# Patient Record
Sex: Female | Born: 1935 | Race: White | Hispanic: No | Marital: Married | Smoking: Never smoker
Health system: Southern US, Community
[De-identification: ages and names within clinical notes are randomized; demographics above are authoritative.]

## PROBLEM LIST (undated history)

## (undated) DIAGNOSIS — K439 Ventral hernia without obstruction or gangrene: Secondary | ICD-10-CM

## (undated) DIAGNOSIS — F0390 Unspecified dementia without behavioral disturbance: Secondary | ICD-10-CM

## (undated) DIAGNOSIS — M1990 Unspecified osteoarthritis, unspecified site: Secondary | ICD-10-CM

## (undated) DIAGNOSIS — N390 Urinary tract infection, site not specified: Secondary | ICD-10-CM

## (undated) DIAGNOSIS — D649 Anemia, unspecified: Secondary | ICD-10-CM

## (undated) DIAGNOSIS — K449 Diaphragmatic hernia without obstruction or gangrene: Secondary | ICD-10-CM

## (undated) DIAGNOSIS — N3281 Overactive bladder: Secondary | ICD-10-CM

## (undated) DIAGNOSIS — K219 Gastro-esophageal reflux disease without esophagitis: Secondary | ICD-10-CM

## (undated) DIAGNOSIS — I517 Cardiomegaly: Secondary | ICD-10-CM

## (undated) DIAGNOSIS — E039 Hypothyroidism, unspecified: Secondary | ICD-10-CM

## (undated) DIAGNOSIS — F329 Major depressive disorder, single episode, unspecified: Secondary | ICD-10-CM

## (undated) DIAGNOSIS — E539 Vitamin B deficiency, unspecified: Secondary | ICD-10-CM

## (undated) DIAGNOSIS — I10 Essential (primary) hypertension: Secondary | ICD-10-CM

## (undated) DIAGNOSIS — I2510 Atherosclerotic heart disease of native coronary artery without angina pectoris: Secondary | ICD-10-CM

## (undated) HISTORY — PX: TOTAL VAGINAL HYSTERECTOMY: SHX2548

## (undated) HISTORY — PX: COLONOSCOPY: SHX174

---

## 2015-06-13 ENCOUNTER — Encounter
Admission: EM | Disposition: A | Discharge status: Other Acute Inpatient Hospital | Payer: Self-pay | Source: Home / Self Care | Attending: Internal Medicine

## 2015-06-13 ENCOUNTER — Inpatient Hospital Stay
Admission: EM | Admit: 2015-06-13 | Discharge: 2015-06-17 | DRG: 369 | Payer: Medicare (Managed Care) | Attending: Internal Medicine | Admitting: Internal Medicine

## 2015-06-13 ENCOUNTER — Inpatient Hospital Stay: Payer: Medicare (Managed Care) | Admitting: Anesthesiology

## 2015-06-13 ENCOUNTER — Encounter: Payer: Self-pay | Admitting: Emergency Medicine

## 2015-06-13 ENCOUNTER — Emergency Department: Payer: Medicare (Managed Care)

## 2015-06-13 DIAGNOSIS — Z66 Do not resuscitate: Secondary | ICD-10-CM | POA: Diagnosis present

## 2015-06-13 DIAGNOSIS — R140 Abdominal distension (gaseous): Secondary | ICD-10-CM | POA: Diagnosis present

## 2015-06-13 DIAGNOSIS — K449 Diaphragmatic hernia without obstruction or gangrene: Secondary | ICD-10-CM | POA: Diagnosis present

## 2015-06-13 DIAGNOSIS — K922 Gastrointestinal hemorrhage, unspecified: Secondary | ICD-10-CM

## 2015-06-13 DIAGNOSIS — E539 Vitamin B deficiency, unspecified: Secondary | ICD-10-CM | POA: Diagnosis present

## 2015-06-13 DIAGNOSIS — R112 Nausea with vomiting, unspecified: Secondary | ICD-10-CM

## 2015-06-13 DIAGNOSIS — Z886 Allergy status to analgesic agent status: Secondary | ICD-10-CM

## 2015-06-13 DIAGNOSIS — K226 Gastro-esophageal laceration-hemorrhage syndrome: Secondary | ICD-10-CM | POA: Diagnosis present

## 2015-06-13 DIAGNOSIS — Z7982 Long term (current) use of aspirin: Secondary | ICD-10-CM | POA: Diagnosis not present

## 2015-06-13 DIAGNOSIS — Z87440 Personal history of urinary (tract) infections: Secondary | ICD-10-CM | POA: Diagnosis not present

## 2015-06-13 DIAGNOSIS — K2971 Gastritis, unspecified, with bleeding: Secondary | ICD-10-CM | POA: Diagnosis present

## 2015-06-13 DIAGNOSIS — F329 Major depressive disorder, single episode, unspecified: Secondary | ICD-10-CM | POA: Diagnosis present

## 2015-06-13 DIAGNOSIS — E039 Hypothyroidism, unspecified: Secondary | ICD-10-CM | POA: Diagnosis present

## 2015-06-13 DIAGNOSIS — I10 Essential (primary) hypertension: Secondary | ICD-10-CM | POA: Diagnosis present

## 2015-06-13 DIAGNOSIS — R0902 Hypoxemia: Secondary | ICD-10-CM | POA: Diagnosis present

## 2015-06-13 DIAGNOSIS — K311 Adult hypertrophic pyloric stenosis: Secondary | ICD-10-CM | POA: Diagnosis present

## 2015-06-13 DIAGNOSIS — R1110 Vomiting, unspecified: Secondary | ICD-10-CM | POA: Insufficient documentation

## 2015-06-13 DIAGNOSIS — K259 Gastric ulcer, unspecified as acute or chronic, without hemorrhage or perforation: Secondary | ICD-10-CM | POA: Diagnosis present

## 2015-06-13 DIAGNOSIS — R109 Unspecified abdominal pain: Secondary | ICD-10-CM

## 2015-06-13 DIAGNOSIS — M1990 Unspecified osteoarthritis, unspecified site: Secondary | ICD-10-CM | POA: Diagnosis present

## 2015-06-13 DIAGNOSIS — Z931 Gastrostomy status: Secondary | ICD-10-CM

## 2015-06-13 DIAGNOSIS — I2510 Atherosclerotic heart disease of native coronary artery without angina pectoris: Secondary | ICD-10-CM | POA: Diagnosis present

## 2015-06-13 DIAGNOSIS — Z90710 Acquired absence of both cervix and uterus: Secondary | ICD-10-CM

## 2015-06-13 DIAGNOSIS — K219 Gastro-esophageal reflux disease without esophagitis: Secondary | ICD-10-CM | POA: Diagnosis present

## 2015-06-13 DIAGNOSIS — Z9981 Dependence on supplemental oxygen: Secondary | ICD-10-CM

## 2015-06-13 DIAGNOSIS — I480 Paroxysmal atrial fibrillation: Secondary | ICD-10-CM | POA: Diagnosis present

## 2015-06-13 DIAGNOSIS — F0390 Unspecified dementia without behavioral disturbance: Secondary | ICD-10-CM | POA: Diagnosis present

## 2015-06-13 DIAGNOSIS — Z888 Allergy status to other drugs, medicaments and biological substances status: Secondary | ICD-10-CM | POA: Diagnosis not present

## 2015-06-13 DIAGNOSIS — E860 Dehydration: Secondary | ICD-10-CM | POA: Diagnosis present

## 2015-06-13 DIAGNOSIS — K920 Hematemesis: Secondary | ICD-10-CM | POA: Diagnosis not present

## 2015-06-13 DIAGNOSIS — K2961 Other gastritis with bleeding: Secondary | ICD-10-CM | POA: Diagnosis not present

## 2015-06-13 DIAGNOSIS — Z0189 Encounter for other specified special examinations: Secondary | ICD-10-CM

## 2015-06-13 DIAGNOSIS — I4891 Unspecified atrial fibrillation: Secondary | ICD-10-CM

## 2015-06-13 DIAGNOSIS — Z8701 Personal history of pneumonia (recurrent): Secondary | ICD-10-CM | POA: Diagnosis not present

## 2015-06-13 DIAGNOSIS — R1084 Generalized abdominal pain: Secondary | ICD-10-CM | POA: Diagnosis not present

## 2015-06-13 HISTORY — PX: ESOPHAGOGASTRODUODENOSCOPY (EGD) WITH PROPOFOL: SHX5813

## 2015-06-13 HISTORY — DX: Atherosclerotic heart disease of native coronary artery without angina pectoris: I25.10

## 2015-06-13 HISTORY — DX: Essential (primary) hypertension: I10

## 2015-06-13 HISTORY — DX: Vitamin B deficiency, unspecified: E53.9

## 2015-06-13 HISTORY — DX: Diaphragmatic hernia without obstruction or gangrene: K44.9

## 2015-06-13 HISTORY — DX: Overactive bladder: N32.81

## 2015-06-13 HISTORY — DX: Cardiomegaly: I51.7

## 2015-06-13 HISTORY — DX: Major depressive disorder, single episode, unspecified: F32.9

## 2015-06-13 HISTORY — DX: Anemia, unspecified: D64.9

## 2015-06-13 HISTORY — DX: Gastro-esophageal reflux disease without esophagitis: K21.9

## 2015-06-13 HISTORY — DX: Unspecified dementia, unspecified severity, without behavioral disturbance, psychotic disturbance, mood disturbance, and anxiety: F03.90

## 2015-06-13 HISTORY — DX: Unspecified osteoarthritis, unspecified site: M19.90

## 2015-06-13 HISTORY — DX: Ventral hernia without obstruction or gangrene: K43.9

## 2015-06-13 HISTORY — DX: Urinary tract infection, site not specified: N39.0

## 2015-06-13 HISTORY — DX: Hypothyroidism, unspecified: E03.9

## 2015-06-13 LAB — COMPREHENSIVE METABOLIC PANEL
ALBUMIN: 4.1 g/dL (ref 3.5–5.0)
ALT: 8 U/L — ABNORMAL LOW (ref 14–54)
ANION GAP: 8 (ref 5–15)
AST: 17 U/L (ref 15–41)
Alkaline Phosphatase: 75 U/L (ref 38–126)
BILIRUBIN TOTAL: 0.8 mg/dL (ref 0.3–1.2)
BUN: 10 mg/dL (ref 6–20)
CO2: 30 mmol/L (ref 22–32)
Calcium: 9.2 mg/dL (ref 8.9–10.3)
Chloride: 100 mmol/L — ABNORMAL LOW (ref 101–111)
Creatinine, Ser: 0.56 mg/dL (ref 0.44–1.00)
GFR calc Af Amer: >60 mL/min (ref >60)
GFR calc non Af Amer: >60 mL/min (ref >60)
GLUCOSE: 151 mg/dL — AB (ref 65–99)
POTASSIUM: 3.5 mmol/L (ref 3.5–5.1)
SODIUM: 138 mmol/L (ref 135–145)
TOTAL PROTEIN: 7.5 g/dL (ref 6.5–8.1)

## 2015-06-13 LAB — URINALYSIS COMPLETE WITH MICROSCOPIC (ARMC ONLY)
Bilirubin Urine: NEGATIVE
Glucose, UA: NEGATIVE mg/dL
Leukocytes, UA: NEGATIVE
Nitrite: NEGATIVE
Protein, ur: 100 mg/dL — AB
Specific Gravity, Urine: 1.013 (ref 1.005–1.030)
pH: 8 (ref 5.0–8.0)

## 2015-06-13 LAB — CBC WITH DIFFERENTIAL/PLATELET
BASOS PCT: 0 %
Basophils Absolute: 0 10*3/uL (ref 0–0.1)
EOS ABS: 0 10*3/uL (ref 0–0.7)
Eosinophils Relative: 0 %
HCT: 53.8 % — ABNORMAL HIGH (ref 35.0–47.0)
Hemoglobin: 17.9 g/dL — ABNORMAL HIGH (ref 12.0–16.0)
LYMPHS ABS: 0.9 10*3/uL — AB (ref 1.0–3.6)
Lymphocytes Relative: 9 %
MCH: 32 pg (ref 26.0–34.0)
MCHC: 33.3 g/dL (ref 32.0–36.0)
MCV: 96.2 fL (ref 80.0–100.0)
MONO ABS: 0.4 10*3/uL (ref 0.2–0.9)
MONOS PCT: 4 %
NEUTROS ABS: 8.3 10*3/uL — AB (ref 1.4–6.5)
Neutrophils Relative %: 87 %
PLATELETS: 187 10*3/uL (ref 150–440)
RBC: 5.59 MIL/uL — ABNORMAL HIGH (ref 3.80–5.20)
RDW: 12.6 % (ref 11.5–14.5)
WBC: 9.6 10*3/uL (ref 3.6–11.0)

## 2015-06-13 LAB — HEMOGLOBIN AND HEMATOCRIT, BLOOD
HCT: 50.6 % — ABNORMAL HIGH (ref 35.0–47.0)
HEMATOCRIT: 47.6 % — AB (ref 35.0–47.0)
HEMOGLOBIN: 16.5 g/dL — AB (ref 12.0–16.0)
Hemoglobin: 15.5 g/dL (ref 12.0–16.0)

## 2015-06-13 LAB — TYPE AND SCREEN
ABO/RH(D): O POS
ANTIBODY SCREEN: NEGATIVE

## 2015-06-13 LAB — PROTIME-INR
INR: 1.02
Prothrombin Time: 13.6 seconds (ref 11.4–15.0)

## 2015-06-13 LAB — TSH: TSH: 1.438 u[IU]/mL (ref 0.350–4.500)

## 2015-06-13 LAB — LIPASE, BLOOD: Lipase: 20 U/L (ref 11–51)

## 2015-06-13 LAB — TROPONIN I: Troponin I: <0.03 ng/mL (ref <0.031)

## 2015-06-13 LAB — ABO/RH: ABO/RH(D): O POS

## 2015-06-13 LAB — HEMOGLOBIN A1C: Hgb A1c MFr Bld: 6 % (ref 4.0–6.0)

## 2015-06-13 SURGERY — ESOPHAGOGASTRODUODENOSCOPY (EGD) WITH PROPOFOL
Anesthesia: General

## 2015-06-13 MED ORDER — SENNOSIDES-DOCUSATE SODIUM 8.6-50 MG PO TABS
1.0000 | ORAL_TABLET | Freq: Two times a day (BID) | ORAL | Status: DC
Start: 2015-06-13 — End: 2015-06-17
  Administered 2015-06-13 – 2015-06-17 (×7): 1 via ORAL
  Filled 2015-06-13 (×8): qty 1

## 2015-06-13 MED ORDER — METOPROLOL TARTRATE 1 MG/ML IV SOLN: 5.0000 mg | INTRAVENOUS | Status: DC | PRN

## 2015-06-13 MED ORDER — IPRATROPIUM-ALBUTEROL 0.5-2.5 (3) MG/3ML IN SOLN: 3.0000 mL | RESPIRATORY_TRACT | Status: DC | PRN

## 2015-06-13 MED ORDER — SODIUM CHLORIDE 0.9 % IJ SOLN
INTRAMUSCULAR | Status: AC
Start: 2015-06-13 — End: 2015-06-13
  Administered 2015-06-13: 16:00:00
  Filled 2015-06-13: qty 10

## 2015-06-13 MED ORDER — PROPOFOL 500 MG/50ML IV EMUL
INTRAVENOUS | Status: DC | PRN
  Administered 2015-06-13: 100 ug/kg/min via INTRAVENOUS

## 2015-06-13 MED ORDER — OLANZAPINE 10 MG PO TABS
5.0000 mg | ORAL_TABLET | Freq: Every day | ORAL | Status: DC
  Administered 2015-06-14: 5 mg via ORAL
  Administered 2015-06-15: 09:00:00 via ORAL
  Administered 2015-06-16 – 2015-06-17 (×2): 5 mg via ORAL
  Filled 2015-06-13 (×2): qty 1
  Filled 2015-06-13: qty 2
  Filled 2015-06-13: qty 1

## 2015-06-13 MED ORDER — TRAZODONE HCL 100 MG PO TABS
100.0000 mg | ORAL_TABLET | Freq: Every day | ORAL | Status: DC
  Administered 2015-06-13 – 2015-06-14 (×2): 100 mg via ORAL
  Filled 2015-06-13 (×2): qty 1

## 2015-06-13 MED ORDER — FERROUS SULFATE 325 (65 FE) MG PO TABS
325.0000 mg | ORAL_TABLET | Freq: Every day | ORAL | Status: DC
  Administered 2015-06-14 – 2015-06-17 (×3): 325 mg via ORAL
  Filled 2015-06-13 (×3): qty 1

## 2015-06-13 MED ORDER — AMLODIPINE BESYLATE 10 MG PO TABS
10.0000 mg | ORAL_TABLET | Freq: Every day | ORAL | Status: DC
  Administered 2015-06-14 – 2015-06-15 (×2): 10 mg via ORAL
  Filled 2015-06-13 (×2): qty 1

## 2015-06-13 MED ORDER — GLUCOSE 40 % PO GEL: 1.0000 | ORAL | Status: DC | PRN

## 2015-06-13 MED ORDER — LEVOTHYROXINE SODIUM 175 MCG PO TABS
175.0000 ug | ORAL_TABLET | Freq: Every day | ORAL | Status: DC
  Administered 2015-06-14 – 2015-06-17 (×4): 175 ug via ORAL
  Filled 2015-06-13 (×4): qty 1

## 2015-06-13 MED ORDER — ONDANSETRON HCL 4 MG/2ML IJ SOLN
4.0000 mg | Freq: Four times a day (QID) | INTRAMUSCULAR | Status: DC | PRN
  Administered 2015-06-13 (×2): 4 mg via INTRAVENOUS
  Filled 2015-06-13 (×2): qty 2

## 2015-06-13 MED ORDER — ONDANSETRON HCL 4 MG/2ML IJ SOLN
4.0000 mg | Freq: Once | INTRAMUSCULAR | Status: AC
  Administered 2015-06-13: 4 mg via INTRAVENOUS
  Filled 2015-06-13: qty 2

## 2015-06-13 MED ORDER — TRAMADOL HCL 50 MG PO TABS
50.0000 mg | ORAL_TABLET | Freq: Four times a day (QID) | ORAL | Status: DC
  Administered 2015-06-13 – 2015-06-17 (×12): 50 mg via ORAL
  Filled 2015-06-13 (×13): qty 1

## 2015-06-13 MED ORDER — GABAPENTIN 300 MG PO CAPS
300.0000 mg | ORAL_CAPSULE | Freq: Three times a day (TID) | ORAL | Status: DC
  Administered 2015-06-13 – 2015-06-17 (×10): 300 mg via ORAL
  Filled 2015-06-13 (×10): qty 1

## 2015-06-13 MED ORDER — SODIUM CHLORIDE 0.9 % IV BOLUS (SEPSIS)
1000.0000 mL | Freq: Once | INTRAVENOUS | Status: AC
  Administered 2015-06-13: 1000 mL via INTRAVENOUS

## 2015-06-13 MED ORDER — VITAMIN D (ERGOCALCIFEROL) 1.25 MG (50000 UNIT) PO CAPS
50000.0000 [IU] | ORAL_CAPSULE | ORAL | Status: DC
  Filled 2015-06-13: qty 1

## 2015-06-13 MED ORDER — ONDANSETRON HCL 4 MG PO TABS: 4.0000 mg | ORAL_TABLET | Freq: Three times a day (TID) | ORAL | Status: DC | PRN

## 2015-06-13 MED ORDER — SODIUM CHLORIDE 0.9 % IJ SOLN
3.0000 mL | Freq: Two times a day (BID) | INTRAMUSCULAR | Status: DC
  Administered 2015-06-13 – 2015-06-15 (×2): 3 mL via INTRAVENOUS
  Administered 2015-06-15: 09:00:00 via INTRAVENOUS
  Administered 2015-06-16 (×2): 3 mL via INTRAVENOUS

## 2015-06-13 MED ORDER — DILTIAZEM HCL 25 MG/5ML IV SOLN
5.0000 mg | Freq: Once | INTRAVENOUS | Status: AC
  Administered 2015-06-13: 5 mg via INTRAVENOUS

## 2015-06-13 MED ORDER — PROMETHAZINE HCL 25 MG/ML IJ SOLN
INTRAMUSCULAR | Status: AC
  Administered 2015-06-13: 12.5 mg via INTRAVENOUS
  Filled 2015-06-13: qty 1

## 2015-06-13 MED ORDER — VITAMIN B-12 1000 MCG PO TABS
1000.0000 ug | ORAL_TABLET | Freq: Every day | ORAL | Status: DC
  Administered 2015-06-14 – 2015-06-17 (×4): 1000 ug via ORAL
  Filled 2015-06-13 (×4): qty 1

## 2015-06-13 MED ORDER — DILTIAZEM HCL 30 MG PO TABS
30.0000 mg | ORAL_TABLET | Freq: Three times a day (TID) | ORAL | Status: DC
  Administered 2015-06-13 – 2015-06-15 (×7): 30 mg via ORAL
  Filled 2015-06-13 (×7): qty 1

## 2015-06-13 MED ORDER — DEXTROSE 5 % IV SOLN
5.0000 mg/h | INTRAVENOUS | Status: DC
  Filled 2015-06-13: qty 100

## 2015-06-13 MED ORDER — DILTIAZEM HCL 25 MG/5ML IV SOLN
INTRAVENOUS | Status: AC
  Administered 2015-06-13: 5 mg via INTRAVENOUS
  Filled 2015-06-13: qty 5

## 2015-06-13 MED ORDER — PANTOPRAZOLE SODIUM 40 MG IV SOLR
40.0000 mg | Freq: Once | INTRAVENOUS | Status: AC
  Administered 2015-06-13: 40 mg via INTRAVENOUS
  Filled 2015-06-13: qty 40

## 2015-06-13 MED ORDER — PROCHLORPERAZINE EDISYLATE 5 MG/ML IJ SOLN
10.0000 mg | Freq: Once | INTRAMUSCULAR | Status: AC
  Administered 2015-06-13: 10 mg via INTRAVENOUS
  Filled 2015-06-13: qty 2

## 2015-06-13 MED ORDER — CLONIDINE HCL 0.1 MG PO TABS: 0.1000 mg | ORAL_TABLET | ORAL | Status: DC | PRN

## 2015-06-13 MED ORDER — PROMETHAZINE HCL 25 MG/ML IJ SOLN
12.5000 mg | Freq: Four times a day (QID) | INTRAMUSCULAR | Status: DC | PRN
  Administered 2015-06-13: 12.5 mg via INTRAVENOUS
  Filled 2015-06-13: qty 1

## 2015-06-13 MED ORDER — ACETAMINOPHEN 325 MG PO TABS: 650.0000 mg | ORAL_TABLET | Freq: Four times a day (QID) | ORAL | Status: DC | PRN

## 2015-06-13 MED ORDER — SODIUM CHLORIDE 0.9 % IV SOLN
INTRAVENOUS | Status: DC
Start: 2015-06-13 — End: 2015-06-14
  Administered 2015-06-13 – 2015-06-14 (×2): via INTRAVENOUS

## 2015-06-13 MED ORDER — PROMETHAZINE HCL 25 MG/ML IJ SOLN
12.5000 mg | Freq: Once | INTRAMUSCULAR | Status: AC
  Administered 2015-06-13: 12.5 mg via INTRAVENOUS

## 2015-06-13 MED ORDER — PROPOFOL 10 MG/ML IV BOLUS
INTRAVENOUS | Status: DC | PRN
  Administered 2015-06-13: 70 mg via INTRAVENOUS

## 2015-06-13 MED ORDER — POLYETHYLENE GLYCOL 3350 17 G PO PACK
17.0000 g | PACK | Freq: Every day | ORAL | Status: DC
  Administered 2015-06-14 – 2015-06-17 (×3): 17 g via ORAL
  Filled 2015-06-13 (×4): qty 1

## 2015-06-13 MED ORDER — ACETAMINOPHEN 650 MG RE SUPP: 650.0000 mg | Freq: Four times a day (QID) | RECTAL | Status: DC | PRN

## 2015-06-13 MED ORDER — CITALOPRAM HYDROBROMIDE 20 MG PO TABS
20.0000 mg | ORAL_TABLET | Freq: Every day | ORAL | Status: DC
  Administered 2015-06-14 – 2015-06-17 (×4): 20 mg via ORAL
  Filled 2015-06-13 (×4): qty 1

## 2015-06-13 MED ORDER — LIDOCAINE HCL (CARDIAC) 20 MG/ML IV SOLN
INTRAVENOUS | Status: DC | PRN
  Administered 2015-06-13: 50 mg via INTRAVENOUS

## 2015-06-13 MED ORDER — PANTOPRAZOLE SODIUM 40 MG IV SOLR
8.0000 mg/h | INTRAVENOUS | Status: AC
  Administered 2015-06-13 – 2015-06-15 (×5): 8 mg/h via INTRAVENOUS
  Filled 2015-06-13 (×5): qty 80

## 2015-06-13 MED ORDER — ONDANSETRON HCL 4 MG PO TABS: 4.0000 mg | ORAL_TABLET | Freq: Four times a day (QID) | ORAL | Status: DC | PRN

## 2015-06-13 NOTE — Progress Notes (Signed)
Patient is alert and orientedx3.  Very anxious throughout the shift.  Hgb stable, Dr. Servando SnareWohl consulted.  Possible scope today or tomorrow pending.    Has had emesis x3, coffee ground and 1 black tarry stool.  No complaints of pain.

## 2015-06-13 NOTE — ED Notes (Signed)
Pt vomited after taking oral meds. Did not visualize pill.

## 2015-06-13 NOTE — ED Notes (Signed)
Pt vomited x 1- coffee ground emesis.

## 2015-06-13 NOTE — ED Notes (Signed)
Report called to 2A 

## 2015-06-13 NOTE — Anesthesia Preprocedure Evaluation (Signed)
Anesthesia Evaluation  Patient identified by MRN, date of birth, ID band Patient awake    Reviewed: Allergy & Precautions, NPO status , Patient's Chart, lab work & pertinent test results, reviewed documented beta blocker date and time   Airway Mallampati: III  TM Distance: >3 FB     Dental  (+) Chipped   Pulmonary           Cardiovascular hypertension, Pt. on medications + CAD       Neuro/Psych PSYCHIATRIC DISORDERS Depression  Neuromuscular disease    GI/Hepatic hiatal hernia, GERD  ,  Endo/Other  Hypothyroidism   Renal/GU      Musculoskeletal  (+) Arthritis , Osteoarthritis,    Abdominal   Peds  Hematology   Anesthesia Other Findings   Reproductive/Obstetrics                             Anesthesia Physical Anesthesia Plan  ASA: III  Anesthesia Plan: General   Post-op Pain Management:    Induction: Intravenous  Airway Management Planned: Nasal Cannula  Additional Equipment:   Intra-op Plan:   Post-operative Plan:   Informed Consent: I have reviewed the patients History and Physical, chart, labs and discussed the procedure including the risks, benefits and alternatives for the proposed anesthesia with the patient or authorized representative who has indicated his/her understanding and acceptance.     Plan Discussed with: CRNA  Anesthesia Plan Comments:         Anesthesia Quick Evaluation

## 2015-06-13 NOTE — ED Notes (Signed)
Patient presents to ED from [facility] c/o nausea and vomiting since 2 pm. Patient reports "I think it was a piece of pumpkin pie that made me drink." EMS administered 4 mg of Zofran PTA without relief. Pt dry heaving during triage, mouth appears dry. Patient denies diarrhea, chest pain, shortness of breath, or fevers. Per EMS pt was diagnosed with pneumonia last year [facility] report pt O2 saturations has maintained in low 90s. atient alert, skin warm and dry.

## 2015-06-13 NOTE — ED Notes (Signed)
Per CCU nurse pt needs to be placed to Telemetry bed.

## 2015-06-13 NOTE — Progress Notes (Signed)
Pt to go to endo for egd. Pt is anxious, but a/o. ivf's infusing. New protonix gtt hung. Report given to endo.

## 2015-06-13 NOTE — Transfer of Care (Signed)
Immediate Anesthesia Transfer of Care Note  Patient: Tracey Yates  Procedure(s) Performed: Procedure(s): ESOPHAGOGASTRODUODENOSCOPY (EGD) WITH PROPOFOL (N/A)  Patient Location: Endoscopy Unit  Anesthesia Type:General  Level of Consciousness: awake  Airway & Oxygen Therapy: Patient Spontanous Breathing and Patient connected to face mask oxygen  Post-op Assessment: Report given to RN  Post vital signs: Reviewed  Last Vitals:  Filed Vitals:   06/13/15 1658 06/13/15 1738  BP: 132/75 127/74  Pulse: 99 117  Temp: 38.2 C 37.5 C  Resp:  22    Complications: No apparent anesthesia complications

## 2015-06-13 NOTE — Consult Note (Signed)
[facility]  [address], [address] [place], [place] [address] Phone: [phone] Fax : [phone]  Consultation  Referring Provider:     No ref. provider found Primary Care Physician:  No primary care provider on file. Primary Gastroenterologist:           Reason for Consultation:     Hematemesis and melena  Date of Admission:  06/13/2015 Date of Consultation:  06/13/2015         HPI:   Tracey Yates is a 81 y.o. female who was admitted with epigastric pain and nausea vomiting. The patient was vomiting up coffee ground emesis. She also had black stools. The patient states that the black stools were tarry. The patient has continued to have nausea whenever she moves around and reports that she is not hungry and has no appetite due to the nausea. She denies any anti-inflammatory use such as Advil he Motrin or BCs. She only takes a aspirin for her heart day. There is no prior history of any GI bleeding or peptic ulcer disease. The patient also denies ever having an upper endoscopy or colonoscopy in the past.  Past Medical History  Diagnosis Date  . Dementia   . Major depression (HCC)   . GERD (gastroesophageal reflux disease)   . Coronary artery disease   . Hiatal hernia   . Overactive bladder   . Ventral hernia   . Hypertension   . Anemia   . Cardiomegaly   . Recurrent UTI   . Vitamin B deficiency   . Hypothyroidism   . Arthritis     Past Surgical History  Procedure Laterality Date  . Colonoscopy    . Total vaginal hysterectomy      Prior to Admission medications   Medication Sig Start Date End Date Taking? Authorizing Provider  acetaminophen (TYLENOL) 500 MG tablet Take 500 mg by mouth 4 (four) times daily.   Yes Historical Provider, MD  acetaminophen (TYLENOL) 500 MG tablet Take 500 mg by mouth every 4 (four) hours as needed for mild pain or moderate pain.   Yes Historical Provider, MD  amLODipine (NORVASC) 10 MG tablet Take 10 mg by mouth daily.   Yes  Historical Provider, MD  aspirin EC 81 MG tablet Take 81 mg by mouth daily.   Yes Historical Provider, MD  citalopram (CELEXA) 20 MG tablet Take 20 mg by mouth daily.   Yes Historical Provider, MD  cloNIDine (CATAPRES) 0.1 MG tablet Take 0.1 mg by mouth as needed (blood pressure >160/90).   Yes Historical Provider, MD  dextrose (GLUTOSE) 40 % GEL Take 1 Tube by mouth as needed for low blood sugar.   Yes Historical Provider, MD  ferrous sulfate 325 (65 FE) MG tablet Take 325 mg by mouth daily with breakfast.   Yes Historical Provider, MD  gabapentin (NEURONTIN) 300 MG capsule Take 300 mg by mouth 3 (three) times daily.   Yes Historical Provider, MD  ipratropium-albuterol (DUONEB) 0.5-2.5 (3) MG/3ML SOLN Take 3 mLs by nebulization every 4 (four) hours as needed (wheezing/ shortness of breath).   Yes Historical Provider, MD  levothyroxine (SYNTHROID, LEVOTHROID) 175 MCG tablet Take 175 mcg by mouth daily before breakfast.   Yes Historical Provider, MD  NON FORMULARY Take 1 Dose by mouth 2 (two) times daily. Green Tic Tac given d/t constipation White Tic Tac given d/t diarrhea One of each given twice a day   Yes Historical Provider, MD  OLANZapine (ZYPREXA) 5 MG tablet Take 5 mg  by mouth daily.   Yes Historical Provider, MD  omeprazole (PRILOSEC) 20 MG capsule Take 20 mg by mouth 2 (two) times daily before a meal.   Yes Historical Provider, MD  ondansetron (ZOFRAN) 4 MG tablet Take 4 mg by mouth every 8 (eight) hours as needed for nausea or vomiting.   Yes Historical Provider, MD  polyethylene glycol (MIRALAX / GLYCOLAX) packet Take 17 g by mouth daily.   Yes Historical Provider, MD  senna-docusate (SENOKOT-S) 8.6-50 MG tablet Take 1 tablet by mouth 2 (two) times daily.   Yes Historical Provider, MD  traMADol (ULTRAM) 50 MG tablet Take 50 mg by mouth 4 (four) times daily.   Yes Historical Provider, MD  traZODone (DESYREL) 100 MG tablet Take 100 mg by mouth at bedtime.   Yes Historical Provider, MD    vitamin B-12 (CYANOCOBALAMIN) 1000 MCG tablet Take 1,000 mcg by mouth daily.   Yes Historical Provider, MD  Vitamin D, Ergocalciferol, (DRISDOL) 50000 UNITS CAPS capsule Take 50,000 Units by mouth every 7 (seven) days.   Yes Historical Provider, MD    History reviewed. No pertinent family history.   Social History  Substance Use Topics  . Smoking status: Never Smoker   . Smokeless tobacco: None  . Alcohol Use: None    Allergies as of 06/13/2015 - Review Complete 06/13/2015  Allergen Reaction Noted  . Codeine Other (See Comments) 06/13/2015  . Nsaids Other (See Comments) 06/13/2015  . Valium [diazepam] Other (See Comments) 06/13/2015    Review of Systems:    All systems reviewed and negative except where noted in HPI.   Physical Exam:  Vital signs in last 24 hours: Temp:  [98.8 F (37.1 C)-99.6 F (37.6 C)] 99.6 F (37.6 C) (11/21 1110) Pulse Rate:  [88-136] 99 (11/21 1430) Resp:  [14-33] 14 (11/21 1110) BP: (103-159)/(78-99) 157/78 mmHg (11/21 1430) SpO2:  [86 %-97 %] 97 % (11/21 1430) Weight:  [266 lb (120.657 kg)-277 lb (125.646 kg)] 266 lb (120.657 kg) (11/21 1110) Last BM Date: 06/13/15 General:   Pleasant, cooperative in NAD Head:  Normocephalic and atraumatic. Eyes:   No icterus.   Conjunctiva pink. PERRLA. Ears:  Normal auditory acuity. Neck:  Supple; no masses or thyroidomegaly Lungs: Respirations even and unlabored. Lungs clear to auscultation bilaterally.   No wheezes, crackles, or rhonchi.  Heart:  Regular rate and rhythm;  Without murmur, clicks, rubs or gallops Abdomen:  Soft, nondistended, diffusely tender. Normal bowel sounds. No appreciable masses or hepatomegaly.  No rebound or guarding.  Rectal:  Not performed. Msk:  Symmetrical without gross deformities.   Extremities:  Without edema, cyanosis or clubbing. Neurologic:  Alert and oriented x3;  grossly normal neurologically. Skin:  Intact without significant lesions or rashes. Cervical Nodes:  No  significant cervical adenopathy. Psych:  Alert and cooperative. Normal affect.  LAB RESULTS:  Recent Labs  06/13/15 0339 06/13/15 1524  WBC 9.6  --   HGB 17.9* 16.5*  HCT 53.8* 50.6*  PLT 187  --    BMET  Recent Labs  06/13/15 0339  NA 138  K 3.5  CL 100*  CO2 30  GLUCOSE 151*  BUN 10  CREATININE 0.56  CALCIUM 9.2   LFT  Recent Labs  06/13/15 0339  PROT 7.5  ALBUMIN 4.1  AST 17  ALT 8*  ALKPHOS 75  BILITOT 0.8   PT/INR  Recent Labs  06/13/15 0339  LABPROT 13.6  INR 1.02    [facility] [address]  [facility]13/2015  CLINICAL DATA:  Nausea and vomiting. Hypoxia. History of pneumonia. EXAM: PORTABLE CHEST 1 VIEW COMPARISON:  None. FINDINGS: The cardiac silhouette appears moderately enlarged. Mediastinal silhouette is nonsuspicious. Elevated RIGHT hemidiaphragm. No pleural effusion or focal consolidation. No pneumothorax. Old RIGHT posterior rib fractures. Osteopenia. Soft tissue planes are nonsuspicious. IMPRESSION: Moderate cardiomegaly, no acute pulmonary process. Electronically Signed   By: Awilda Metro M.D.   On: 06/13/2015 04:51      Impression / Plan:   Tracey Yates is a 81 y.o. y/o female with hematemesis and melena. The patient will be set up for an EGD for today. The patient and her son have been explained the plan and procedure. I have discussed risks & benefits which include, but are not limited to, bleeding, infection, perforation & drug reaction.  The patient agrees with this plan & written consent will be obtained.      Thank you for involving me in the care of this patient.      LOS: 0 days   Darlina Rumpf, MD  06/13/2015, 4:23 PM   Note: This dictation was prepared with Dragon dictation along with smaller phrase technology. Any transcriptional errors that result from this process are unintentional.

## 2015-06-13 NOTE — ED Notes (Signed)
Admitting MD at bedside.

## 2015-06-13 NOTE — ED Notes (Signed)
Per Leslie Dalesoll on 2A pts order for cardizem has to be discontinued before bed request will be accepted.

## 2015-06-13 NOTE — ED Provider Notes (Signed)
[facility] Emergency Department Provider Note  ____________________________________________  Time seen: Approximately 3:39 AM  I have reviewed the triage vital signs and the nursing notes.   HISTORY  Chief Complaint Nausea and Emesis    HPI Tracey Yates is a 81 y.o. female who presents to the ED from nursing home with a chief complaint of nausea and vomiting. Patient states onset of upper abdominal pain, nausea and vomiting since approximately 2 PM. Reports 4 episodes of emesis. Denies diarrhea. Denies associated symptoms of fever, chills, chest pain, shortness of breath, dysuria. Per EMS, nursing staff reports patient has been mildly hypoxic with room air saturations in the low 90s since being diagnosed with pneumonia last year. She is not on oxygen therapy at the nursing facility. Denies recent travel or trauma. Nothing makes her symptoms better or worse.   Past Medical History  Diagnosis Date  . Dementia   . Major depression (HCC)   . GERD (gastroesophageal reflux disease)   . Coronary artery disease   . Hiatal hernia   . Overactive bladder   . Ventral hernia   . Hypertension   . Anemia   . Cardiomegaly   . Recurrent UTI   . Vitamin B deficiency   . Hypothyroidism   . Arthritis     There are no active problems to display for this patient.   Past Surgical History  Procedure Laterality Date  . Colonoscopy    . Total vaginal hysterectomy      Current Outpatient Rx  Name  Route  Sig  Dispense  Refill  . acetaminophen (TYLENOL) 500 MG tablet   Oral   Take 500 mg by mouth 4 (four) times daily.         Marland Kitchen. acetaminophen (TYLENOL) 500 MG tablet   Oral   Take 500 mg by mouth every 4 (four) hours as needed for mild pain or moderate pain.         Marland Kitchen. amLODipine (NORVASC) 10 MG tablet   Oral   Take 10 mg by mouth daily.         Marland Kitchen. aspirin EC 81 MG tablet   Oral   Take 81 mg by mouth daily.         . citalopram (CELEXA) 20 MG tablet    Oral   Take 20 mg by mouth daily.         . cloNIDine (CATAPRES) 0.1 MG tablet   Oral   Take 0.1 mg by mouth as needed (blood pressure >160/90).         Marland Kitchen. dextrose (GLUTOSE) 40 % GEL   Oral   Take 1 Tube by mouth as needed for low blood sugar.         . ferrous sulfate 325 (65 FE) MG tablet   Oral   Take 325 mg by mouth daily with breakfast.         . gabapentin (NEURONTIN) 300 MG capsule   Oral   Take 300 mg by mouth 3 (three) times daily.         Marland Kitchen. ipratropium-albuterol (DUONEB) 0.5-2.5 (3) MG/3ML SOLN   Nebulization   Take 3 mLs by nebulization every 4 (four) hours as needed (wheezing/ shortness of breath).         Marland Kitchen. levothyroxine (SYNTHROID, LEVOTHROID) 175 MCG tablet   Oral   Take 175 mcg by mouth daily before breakfast.         . NON FORMULARY   Oral   Take  1 Dose by mouth 2 (two) times daily. Green Tic Tac given d/t constipation White Tic Tac given d/t diarrhea One of each given twice a day         . OLANZapine (ZYPREXA) 5 MG tablet   Oral   Take 5 mg by mouth daily.         Marland Kitchen omeprazole (PRILOSEC) 20 MG capsule   Oral   Take 20 mg by mouth 2 (two) times daily before a meal.         . ondansetron (ZOFRAN) 4 MG tablet   Oral   Take 4 mg by mouth every 8 (eight) hours as needed for nausea or vomiting.         . polyethylene glycol (MIRALAX / GLYCOLAX) packet   Oral   Take 17 g by mouth daily.         Marland Kitchen senna-docusate (SENOKOT-S) 8.6-50 MG tablet   Oral   Take 1 tablet by mouth 2 (two) times daily.         . traMADol (ULTRAM) 50 MG tablet   Oral   Take 50 mg by mouth 4 (four) times daily.         . traZODone (DESYREL) 100 MG tablet   Oral   Take 100 mg by mouth at bedtime.         . vitamin B-12 (CYANOCOBALAMIN) 1000 MCG tablet   Oral   Take 1,000 mcg by mouth daily.         . Vitamin D, Ergocalciferol, (DRISDOL) 50000 UNITS CAPS capsule   Oral   Take 50,000 Units by mouth every 7 (seven) days.            Allergies Codeine; Nsaids; and Valium  No family history on file.  Social History Social History  Substance Use Topics  . Smoking status: Not on file  . Smokeless tobacco: Not on file  . Alcohol Use: Not on file    Review of Systems Constitutional: No fever/chills Eyes: No visual changes. ENT: No sore throat. Cardiovascular: Denies chest pain. Respiratory: Denies shortness of breath. Gastrointestinal: Positive for abdominal pain.  Positive for nausea and vomiting.  No diarrhea.  No constipation. Genitourinary: Negative for dysuria. Musculoskeletal: Negative for back pain. Skin: Negative for rash. Neurological: Negative for headaches, focal weakness or numbness.  10-point ROS otherwise negative.  ____________________________________________   PHYSICAL EXAM:  VITAL SIGNS: ED Triage Vitals  Enc Vitals Group     BP 06/13/15 0330 148/99 mmHg     Pulse Rate 06/13/15 0330 104     Resp 06/13/15 0330 20     Temp 06/13/15 0330 99.6 F (37.6 C)     Temp Source 06/13/15 0330 Oral     SpO2 06/13/15 0319 93 %     Weight 06/13/15 0330 277 lb (125.646 kg)     Height 06/13/15 0330 5\' 3"  (1.6 m)     Head Cir --      Peak Flow --      Pain Score 06/13/15 0332 10     Pain Loc --      Pain Edu? --      Excl. in GC? --     Constitutional: Alert and oriented. Tired appearing and in mild acute distress. Eyes: Conjunctivae are normal. PERRL. EOMI. Head: Atraumatic. Nose: No congestion/rhinnorhea. Mouth/Throat: Mucous membranes are mildly dry.  There is coffee-ground substance on tongue.  Oropharynx non-erythematous. Neck: No stridor.   Cardiovascular: Normal rate, regular rhythm. Grossly normal heart  sounds.  Good peripheral circulation. Respiratory: Normal respiratory effort.  No retractions. Lungs CTAB. Gastrointestinal: Soft and mildly tender to palpation epigastrium without rebound or guarding. No distention. No abdominal bruits. No CVA tenderness. Musculoskeletal: No  lower extremity tenderness. BLE wrapped in compression bandages secondary to edema.  No joint effusions. Neurologic:  Normal speech and language. No gross focal neurologic deficits are appreciated.  Skin:  Skin is warm, dry and intact. No rash noted. No petechiae. Psychiatric: Mood and affect are normal. Speech and behavior are normal.  ____________________________________________   LABS (all labs ordered are listed, but only abnormal results are displayed)  Labs Reviewed  CBC WITH DIFFERENTIAL/PLATELET - Abnormal; Notable for the following:    RBC 5.59 (*)    Hemoglobin 17.9 (*)    HCT 53.8 (*)    Neutro Abs 8.3 (*)    Lymphs Abs 0.9 (*)    All other components within normal limits  COMPREHENSIVE METABOLIC PANEL - Abnormal; Notable for the following:    Chloride 100 (*)    Glucose, Bld 151 (*)    ALT 8 (*)    All other components within normal limits  LIPASE, BLOOD  TROPONIN I  PROTIME-INR  URINALYSIS COMPLETEWITH MICROSCOPIC ([facility] ONLY)  TYPE AND SCREEN  ABO/RH   ____________________________________________  EKG  ED ECG REPORT I, Norah Fick J, the attending physician, personally viewed and interpreted this ECG.   Date: 06/13/2015  EKG Time: 0328  Rate: 106  Rhythm: sinus tachycardia  Axis: Normal  Intervals:none  ST&T Change: Nonspecific  ____________________________________________  RADIOLOGY  Portable chest x-ray (viewed by me, interpreted per Dr. Karie Kirks): Moderate cardiomegaly, no acute pulmonary process. ____________________________________________   PROCEDURES  Procedure(s) performed:  Rectal exam: External exam WNL. Black stool on gloved finger which is immediately heme +.  Critical Care performed: Yes, see critical care note(s)   CRITICAL CARE Performed by: Irean Hong   Total critical care time: 30 minutes  Critical care time was exclusive of separately billable procedures and treating other patients.  Critical care was necessary to  treat or prevent imminent or life-threatening deterioration.  Critical care was time spent personally by me on the following activities: development of treatment plan with patient and/or surrogate as well as nursing, discussions with consultants, evaluation of patient's response to treatment, examination of patient, obtaining history from patient or surrogate, ordering and performing treatments and interventions, ordering and review of laboratory studies, ordering and review of radiographic studies, pulse oximetry and re-evaluation of patient's condition.  ____________________________________________   INITIAL IMPRESSION / ASSESSMENT AND PLAN / ED COURSE  Pertinent labs & imaging results that were available during my care of the patient were reviewed by me and considered in my medical decision making (see chart)  81 year old female from a nursing facility with coffee-ground emesis and melena. Will obtain screening lab work including type and screen, initiate IV fluid resuscitation, IV Protonix bolus and drip. Anticipate hospital admission.  ----------------------------------------- 4:31 AM on 06/13/2015 -----------------------------------------  Discuss with hospitalist who will evaluate patient in the emergency department for admission.  ----------------------------------------- 4:42 AM on 06/13/2015 -----------------------------------------  Patient began to vomit again. Heart rate now in the 130s, atrial fibrillation. Low-dose Phenergan ordered for nausea. Will closely monitor her heart rate and considered Cardizem as needed.  ----------------------------------------- 5:12 AM on 06/13/2015 -----------------------------------------  Heart rate better; bounces between 80s to 110s. Nausea improved. ____________________________________________   FINAL CLINICAL IMPRESSION(S) / ED DIAGNOSES  Final diagnoses:  Non-intractable vomiting with nausea, vomiting of unspecified  type  Upper  GI bleed  Dehydration  Atrial fibrillation with rapid ventricular response (HCC)      Irean Hong, MD 06/13/15 [phone]

## 2015-06-13 NOTE — Progress Notes (Signed)
Patients legs are wrapped and patient refuses to let nursing unwrap them.  The dressings are changed every Wednesday, per patient.  Wound care consult ordered.

## 2015-06-13 NOTE — ED Notes (Signed)
Attempted to obtain urine specimen, patient began to void as she was being placed on bedpan. No urine was caught in bedpan.

## 2015-06-13 NOTE — Op Note (Signed)
[facility] Gastroenterology Patient Name: Tracey Yates Procedure Date: 06/13/2015 5:05 PM MRN: [id number] Account #: [id number] Date of Birth: 06/03/1932 Admit Type: Inpatient Age: [age] Room: [facility] ENDO ROOM 4 Gender: Female Note Status: Finalized Procedure:         Upper GI endoscopy Indications:       Hematemesis Providers:         Midge Miniumarren Kiandre Spagnolo, MD Referring MD:      No Local Md, MD (Referring MD) Medicines:         Propofol per Anesthesia Complications:     No immediate complications. Procedure:         Pre-Anesthesia Assessment:                    - Prior to the procedure, a History and Physical was                     performed, and patient medications and allergies were                     reviewed. The patient's tolerance of previous anesthesia                     was also reviewed. The risks and benefits of the procedure                     and the sedation options and risks were discussed with the                     patient. All questions were answered, and informed consent                     was obtained. Prior Anticoagulants: The patient has taken                     no previous anticoagulant or antiplatelet agents. ASA                     Grade Assessment: II - A patient with mild systemic                     disease. After reviewing the risks and benefits, the                     patient was deemed in satisfactory condition to undergo                     the procedure.                    After obtaining informed consent, the endoscope was passed                     under direct vision. Throughout the procedure, the                     patient's blood pressure, pulse, and oxygen saturations                     were monitored continuously. The Endoscope was introduced                     through the mouth, and advanced to the second part of  duodenum. The upper GI endoscopy was accomplished without                     difficulty. The  patient tolerated the procedure well. Findings:      A large hiatus hernia was present.      A medium-sized bleeding Mallory-Weiss tear with stigmata of recent       bleeding was found.      A large hiatus hernia with a few Cameron ulcers was found.      Localized moderate inflammation with hemorrhage characterized by       erosions was found in the gastric body.      The examined duodenum was normal. No blood seen in the duodenum      Retained fluid was found in the entire examined stomach. Impression:        - Large hiatus hernia.                    - Mallory-Weiss tear.                    - Large hiatus hernia with a few Cameron ulcers.                    - Gastritis with hemorrhage.                    - 2.2 litr of fluid removed.                    - Normal examined duodenum.                    - No specimens collected. Recommendation:    - Place NG tube Procedure Code(s): --- Professional ---                    [phone], Esophagogastroduodenoscopy, flexible, transoral;                     diagnostic, including collection of specimen(s) by                     brushing or washing, when performed (separate procedure) Diagnosis Code(s): --- Professional ---                    K44.9, Diaphragmatic hernia without obstruction or gangrene                    K25.9, Gastric ulcer, unspecified as acute or chronic,                     without hemorrhage or perforation                    K22.6, Gastro-esophageal laceration-hemorrhage syndrome                    K29.71, Gastritis, unspecified, with bleeding                    K92.0, Hematemesis CPT copyright 2014 American Medical Association. All rights reserved. The codes documented in this report are preliminary and upon coder review may  be revised to meet current compliance requirements. Midge Minium, MD 06/13/2015 5:40:22 PM This report has been signed electronically. Number of Addenda: 0 Note Initiated On: 06/13/2015 5:05 PM       [facility]

## 2015-06-13 NOTE — ED Notes (Addendum)
Spoke with Dr Amado CoeGouru who stated she would call me back about d/c cardizem drip.

## 2015-06-13 NOTE — ED Notes (Signed)
Spoke with hospitalist about changing bed assignment to 2A, d/t cardizem drip not infusing. VSS pts HR remains below 110. MD will change bed assignment to 2A.

## 2015-06-13 NOTE — ED Notes (Signed)
Pt heart rate ranging from 90s-108, Dr. Sheryle Hailiamond states to hold Cardizem drip at this time.

## 2015-06-13 NOTE — Consult Note (Signed)
[facility] Cardiology  CARDIOLOGY CONSULT NOTE  Patient ID: Tracey Yates MRN: [id number] DOB/AGE: Apr 03, 1932 81 y.o.  Admit date: 06/13/2015 Referring Physician Gouru Primary Physician  Primary Cardiologist  Reason for Consultation atrial fibrillation  HPI: 80 year old female, resident at [facility], with history of major depression and dementia, referred for atrial fibrillation. The patient presents with chief complaint of nausea and vomiting, to have coffee ground emesis, guaiac positive black tarry stools. She was in atrial fibrillation with a rapid ventricular rate. She was treated with Cardizem IV 5 mg 2 doses. She currently in sinus rhythm at a rate of 90 bpm. Denies chest pain or shortness of breath but to have persistent nausea.  Review of systems complete and found to be negative unless listed above     Past Medical History  Diagnosis Date  . Dementia   . Major depression (HCC)   . GERD (gastroesophageal reflux disease)   . Coronary artery disease   . Hiatal hernia   . Overactive bladder   . Ventral hernia   . Hypertension   . Anemia   . Cardiomegaly   . Recurrent UTI   . Vitamin B deficiency   . Hypothyroidism   . Arthritis     Past Surgical History  Procedure Laterality Date  . Colonoscopy    . Total vaginal hysterectomy      Prescriptions prior to admission  Medication Sig Dispense Refill Last Dose  . acetaminophen (TYLENOL) 500 MG tablet Take 500 mg by mouth 4 (four) times daily.   06/12/2015 at Unknown time  . acetaminophen (TYLENOL) 500 MG tablet Take 500 mg by mouth every 4 (four) hours as needed for mild pain or moderate pain.   prn at prn  . amLODipine (NORVASC) 10 MG tablet Take 10 mg by mouth daily.   06/12/2015 at Unknown time  . aspirin EC 81 MG tablet Take 81 mg by mouth daily.   06/12/2015 at Unknown time  . citalopram (CELEXA) 20 MG tablet Take 20 mg by mouth daily.   06/12/2015 at Unknown time  . cloNIDine (CATAPRES) 0.1 MG tablet Take 0.1 mg by mouth as  needed (blood pressure >160/90).   prn at prn  . dextrose (GLUTOSE) 40 % GEL Take 1 Tube by mouth as needed for low blood sugar.   prn at prn  . ferrous sulfate 325 (65 FE) MG tablet Take 325 mg by mouth daily with breakfast.   06/12/2015 at Unknown time  . gabapentin (NEURONTIN) 300 MG capsule Take 300 mg by mouth 3 (three) times daily.   06/12/2015 at Unknown time  . ipratropium-albuterol (DUONEB) 0.5-2.5 (3) MG/3ML SOLN Take 3 mLs by nebulization every 4 (four) hours as needed (wheezing/ shortness of breath).   prn at prn  . levothyroxine (SYNTHROID, LEVOTHROID) 175 MCG tablet Take 175 mcg by mouth daily before breakfast.   06/12/2015 at Unknown time  . NON FORMULARY Take 1 Dose by mouth 2 (two) times daily. Green Tic Tac given d/t constipation White Tic Tac given d/t diarrhea One of each given twice a day   06/12/2015 at Unknown time  . OLANZapine (ZYPREXA) 5 MG tablet Take 5 mg by mouth daily.   06/12/2015 at Unknown time  . omeprazole (PRILOSEC) 20 MG capsule Take 20 mg by mouth 2 (two) times daily before a meal.   06/12/2015 at Unknown time  . ondansetron (ZOFRAN) 4 MG tablet Take 4 mg by mouth every 8 (eight) hours as needed for nausea or vomiting.  06/12/2015 at Unknown time  . polyethylene glycol (MIRALAX / GLYCOLAX) packet Take 17 g by mouth daily.   06/12/2015 at Unknown time  . senna-docusate (SENOKOT-S) 8.6-50 MG tablet Take 1 tablet by mouth 2 (two) times daily.   06/12/2015 at Unknown time  . traMADol (ULTRAM) 50 MG tablet Take 50 mg by mouth 4 (four) times daily.   06/12/2015 at Unknown time  . traZODone (DESYREL) 100 MG tablet Take 100 mg by mouth at bedtime.   06/12/2015 at Unknown time  . vitamin B-12 (CYANOCOBALAMIN) 1000 MCG tablet Take 1,000 mcg by mouth daily.   06/12/2015 at Unknown time  . Vitamin D, Ergocalciferol, (DRISDOL) 50000 UNITS CAPS capsule Take 50,000 Units by mouth every 7 (seven) days.   06/06/2015 at Unknown time   Social History   Social History  .  Marital Status: Married    Spouse Name: N/A  . Number of Children: N/A  . Years of Education: N/A   Occupational History  . Not on file.   Social History Main Topics  . Smoking status: Unknown If Ever Smoked  . Smokeless tobacco: Not on file  . Alcohol Use: Not on file  . Drug Use: Not on file  . Sexual Activity: Not on file   Other Topics Concern  . Not on file   Social History Narrative  . No narrative on file    History reviewed. No pertinent family history.    Review of systems complete and found to be negative unless listed above      PHYSICAL EXAM  General: Well developed, well nourished, in no acute distress HEENT:  Normocephalic and atramatic Neck:  No JVD.  Lungs: Clear bilaterally to auscultation and percussion. Heart: HRRR . Normal S1 and S2 without gallops or murmurs.  Abdomen: Bowel sounds are positive, abdomen soft and non-tender  Msk:  Back normal, normal gait. Normal strength and tone for age. Extremities: No clubbing, cyanosis or edema.   Neuro: Alert and oriented X 3. Psych:  Good affect, responds appropriately  Labs:   Lab Results  Component Value Date   WBC 9.6 06/13/2015   HGB 17.9* 06/13/2015   HCT 53.8* 06/13/2015   MCV 96.2 06/13/2015   PLT 187 06/13/2015    Recent Labs Lab 06/13/15 0339  NA 138  K 3.5  CL 100*  CO2 30  BUN 10  CREATININE 0.56  CALCIUM 9.2  PROT 7.5  BILITOT 0.8  ALKPHOS 75  ALT 8*  AST 17  GLUCOSE 151*   Lab Results  Component Value Date   TROPONINI <0.03 06/13/2015   No results found for: CHOL No results found for: HDL No results found for: LDLCALC No results found for: TRIG No results found for: CHOLHDL No results found for: LDLDIRECT    Radiology: Dg Chest Port 1 View  06/13/2015  CLINICAL DATA:  Nausea and vomiting. Hypoxia. History of pneumonia. EXAM: PORTABLE CHEST 1 VIEW COMPARISON:  None. FINDINGS: The cardiac silhouette appears moderately enlarged. Mediastinal silhouette is  nonsuspicious. Elevated RIGHT hemidiaphragm. No pleural effusion or focal consolidation. No pneumothorax. Old RIGHT posterior rib fractures. Osteopenia. Soft tissue planes are nonsuspicious. IMPRESSION: Moderate cardiomegaly, no acute pulmonary process. Electronically Signed   By: Awilda Metroourtnay  Bloomer M.D.   On: 06/13/2015 04:51    EKG: Sinus rhythm  ASSESSMENT AND PLAN:   Paroxysmal atrial fibrillation, with rapid ventricular rate, currently in sinus rhythm, exacerbated by nausea, vomiting, hematemesis and GI bleeding.  Recommendations  1. Agree with overall current  therapy 2. Defer anticoagulation in light of GI bleed 3. Consider 2-D echocardiogram 4. IV hydration 5. Aggressively treat nausea and vomiting   Signed: Renner Sebald MD,PhD, [facility] 06/13/2015, 1:44 PM

## 2015-06-13 NOTE — Progress Notes (Signed)
Afib w/ RVR noted in the ED

## 2015-06-13 NOTE — Progress Notes (Signed)
Pt returned from egd. "feels better". No c/o nausea. Pt is a/o, though somewhat "nervous." vss. No c/o pain.

## 2015-06-13 NOTE — Progress Notes (Signed)
NG tube placed in right nare running at low wall suction. Instant return 50ml of grayish black stomach content. Patient tolerated procedure well.  Tube marked for placement. Son at bedside. Will continue to monitor pt.

## 2015-06-13 NOTE — H&P (Addendum)
Tracey Yates is an 81 y.o. female.   Chief Complaint: Vomiting  HPI: The patient presents to the ED with recurrent vomiting.  Nursing home staff reports that emesis appears to be like coffee grounds.  In the emergency department the patient was found to be Guaiaic positive with black tarry stools.  The patient also complains of mid-epigastric pain.  After her rectal exam the patient also went into Afib with RVR which prompted the emergency department to call for admission.  Past Medical History  Diagnosis Date  . Dementia   . Major depression ([place])   . GERD (gastroesophageal reflux disease)   . Coronary artery disease   . Hiatal hernia   . Overactive bladder   . Ventral hernia   . Hypertension   . Anemia   . Cardiomegaly   . Recurrent UTI   . Vitamin B deficiency   . Hypothyroidism   . Arthritis     Past Surgical History  Procedure Laterality Date  . Colonoscopy    . Total vaginal hysterectomy      No family history on file. Social History:  has no tobacco, alcohol, and drug history on file.  Allergies:  Allergies  Allergen Reactions  . Codeine Other (See Comments)    Reaction: unknown  . Nsaids Other (See Comments)    Reaction: unknown  . Valium [Diazepam] Other (See Comments)    Reaction: unknown     (Not in a hospital admission)  Results for orders placed or performed during the hospital encounter of 06/13/15 (from the past 48 hour(s))  CBC with Differential     Status: Abnormal   Collection Time: 06/13/15  3:39 AM  Result Value Ref Range   WBC 9.6 3.6 - 11.0 K/uL   RBC 5.59 (H) 3.80 - 5.20 MIL/uL   Hemoglobin 17.9 (H) 12.0 - 16.0 g/dL   HCT 53.8 (H) 35.0 - 47.0 %   MCV 96.2 80.0 - 100.0 fL   MCH 32.0 26.0 - 34.0 pg   MCHC 33.3 32.0 - 36.0 g/dL   RDW 12.6 11.5 - 14.5 %   Platelets 187 150 - 440 K/uL   Neutrophils Relative % 87 %   Neutro Abs 8.3 (H) 1.4 - 6.5 K/uL   Lymphocytes Relative 9 %   Lymphs Abs 0.9 (L) 1.0 - 3.6 K/uL   Monocytes Relative 4 %    Monocytes Absolute 0.4 0.2 - 0.9 K/uL   Eosinophils Relative 0 %   Eosinophils Absolute 0.0 0 - 0.7 K/uL   Basophils Relative 0 %   Basophils Absolute 0.0 0 - 0.1 K/uL  Comprehensive metabolic panel     Status: Abnormal   Collection Time: 06/13/15  3:39 AM  Result Value Ref Range   Sodium 138 135 - 145 mmol/L   Potassium 3.5 3.5 - 5.1 mmol/L   Chloride 100 (L) 101 - 111 mmol/L   CO2 30 22 - 32 mmol/L   Glucose, Bld 151 (H) 65 - 99 mg/dL   BUN 10 6 - 20 mg/dL   Creatinine, Ser 0.56 0.44 - 1.00 mg/dL   Calcium 9.2 8.9 - 10.3 mg/dL   Total Protein 7.5 6.5 - 8.1 g/dL   Albumin 4.1 3.5 - 5.0 g/dL   AST 17 15 - 41 U/L   ALT 8 (L) 14 - 54 U/L   Alkaline Phosphatase 75 38 - 126 U/L   Total Bilirubin 0.8 0.3 - 1.2 mg/dL   GFR calc non Af Amer >60 >60 mL/min  GFR calc Af Amer >60 >60 mL/min    Comment: (NOTE) The eGFR has been calculated using the CKD EPI equation. This calculation has not been validated in all clinical situations. eGFR's persistently <60 mL/min signify possible Chronic Kidney Disease.    Anion gap 8 5 - 15  Lipase, blood     Status: None   Collection Time: 06/13/15  3:39 AM  Result Value Ref Range   Lipase 20 11 - 51 U/L  Troponin I     Status: None   Collection Time: 06/13/15  3:39 AM  Result Value Ref Range   Troponin I <0.03 <0.031 ng/mL    Comment:        NO INDICATION OF MYOCARDIAL INJURY.   Protime-INR     Status: None   Collection Time: 06/13/15  3:39 AM  Result Value Ref Range   Prothrombin Time 13.6 11.4 - 15.0 seconds   INR 1.02   Type and screen [place]     Status: None (Preliminary result)   Collection Time: 06/13/15  4:07 AM  Result Value Ref Range   ABO/RH(D) PENDING    Antibody Screen PENDING    Sample Expiration 06/16/2015    No results found.  Review of Systems  Constitutional: Negative for fever and chills.  HENT: Negative for sore throat and tinnitus.   Eyes: Negative for blurred vision and redness.   Respiratory: Negative for cough and shortness of breath.   Cardiovascular: Negative for chest pain, palpitations, orthopnea and PND.  Gastrointestinal: Positive for nausea, vomiting and melena. Negative for diarrhea.       +hematemesis  Genitourinary: Negative for dysuria, urgency and frequency.  Musculoskeletal: Negative for myalgias and joint pain.  Skin: Negative for rash.       No lesions  Neurological: Negative for speech change, focal weakness and weakness.  Endo/Heme/Allergies: Does not bruise/bleed easily.       No temperature intolerance  Psychiatric/Behavioral: Negative for depression and suicidal ideas.    Blood pressure 148/99, pulse 104, temperature 99.6 F (37.6 C), temperature source Oral, resp. rate 20, height _0  (1.6 m), weight 125.646 kg (277 lb), SpO2 92 %. Physical Exam  Nursing note and vitals reviewed. Constitutional: She is oriented to person, place, and time. She appears well-developed and well-nourished. No distress.  HENT:  Head: Normocephalic and atraumatic.  Mouth/Throat: Oropharynx is clear and moist.  Eyes: Conjunctivae and EOM are normal. Pupils are equal, round, and reactive to light. No scleral icterus.  Neck: Normal range of motion. Neck supple. No JVD present. No tracheal deviation present. No thyromegaly present.  Cardiovascular: Normal rate, regular rhythm and normal heart sounds.  Exam reveals no gallop and no friction rub.   No murmur heard. Respiratory: Effort normal and breath sounds normal.  GI: Soft. Bowel sounds are normal. She exhibits no distension. There is no tenderness.  Genitourinary:  Deferred  Musculoskeletal: Normal range of motion. She exhibits edema (lower extremities wrapped).  Lymphadenopathy:    She has no cervical adenopathy.  Neurological: She is alert and oriented to person, place, and time. No cranial nerve deficit. She exhibits normal muscle tone.  Skin: Skin is warm and dry. No rash noted. No erythema.   Psychiatric: She has a normal mood and affect. Her behavior is normal. Judgment and thought content normal.     Assessment/Plan This is an 81 year old Caucasian female admitted for GI bleed and Afib with RVR. 1. GI bleed: likely upper GI bleed; the patient  is hemodynamically stable.  Hematemesis and melena.  Aggressive IV hydration.  IV Protonix. Aspirin held.  Gastroenterology consult placed.  NPO except meds and sips. 2. Intractable nausea: symptomatic care 3. Abdominal pain: non-surgical abdomen 4. Afib with RVR: the patient has received a total of 10 mg Diltiazem IV but still is not rate controlled.  Start diltiazem drip.   5. Essential hypertension: continue clonidine and amlodipine 6. Hypothyroidism: cont Synthroid 7. Depression: continue Celexa and olanzipine 8. DVT prophylaxis: SCD's 9. GI prophylaxis: non The patient is a DO NOT RESUSCITATE.  Time spent on admission orders and critical patient care approximately 45 minutes.  Harrie Foreman 06/13/2015, 4:51 AM

## 2015-06-13 NOTE — Progress Notes (Signed)
[facility] Phys[facility] - [place] at [facility]   PATIENT NAME: Tracey Yates    MR#:  [id number]  DATE OF BIRTH:  01-May-1932  SUBJECTIVE:  CHIEF COMPLAINT:  Patient is nauseous and having coffee ground emesis as reported by her son at bedside.  some epigastric abdominal pain  REVIEW OF SYSTEMS:  CONSTITUTIONAL: No fever, fatigue or weakness.  EYES: No blurred or double vision.  EARS, NOSE, AND THROAT: No tinnitus or ear pain.  RESPIRATORY: No cough, shortness of breath, wheezing or hemoptysis.  CARDIOVASCULAR: No chest pain, orthopnea, edema.  GASTROINTESTINAL: Has nausea, coffee-ground vomiting, denies diarrhea  , reports epigastric abdominal pain.  GENITOURINARY: No dysuria, hematuria.  ENDOCRINE: No polyuria, nocturia,  HEMATOLOGY: No anemia, easy bruising or bleeding SKIN: No rash or lesion. MUSCULOSKELETAL: No joint pain or arthritis.   NEUROLOGIC: No tingling, numbness, weakness.  PSYCHIATRY: No anxiety or depression.   DRUG ALLERGIES:   Allergies  Allergen Reactions  . Codeine Other (See Comments)    Reaction: unknown  . Nsaids Other (See Comments)    Reaction: unknown  . Valium [Diazepam] Other (See Comments)    Reaction: unknown    VITALS:  Blood pressure 157/78, pulse 99, temperature 99.6 F (37.6 C), temperature source Oral, resp. rate 14, height  (1.6 m), weight 120.657 kg (266 lb), SpO2 97 %.  PHYSICAL EXAMINATION:  GENERAL:  81 y.o.-year-old patient lying in the bed with no acute distress.  EYES: Pupils equal, round, reactive to light and accommodation. No scleral icterus. Extraocular muscles intact.  HEENT: Head atraumatic, normocephalic. Oropharynx and nasopharynx clear.  NECK:  Supple, no jugular venous distention. No thyroid enlargement, no tenderness.  LUNGS: Normal breath sounds bilaterally, no wheezing, rales,rhonchi or crepitation. No use of accessory muscles of respiration.  CARDIOVASCULAR: S1, S2 normal. No murmurs, rubs, or  gallops.  ABDOMEN: Soft, obese, epigastric tenderness, no rebound tenderness, distended. Bowel sounds present.  EXTREMITIES: No pedal edema, cyanosis, or clubbing.  NEUROLOGIC: arousable but lethargic  PSYCHIATRIC: The patient is alert and oriented x 1-2 SKIN: No obvious rash, lesion, or ulcer.    LABORATORY PANEL:   CBC  Recent Labs Lab 06/13/15 0339  WBC 9.6  HGB 17.9*  HCT 53.8*  PLT 187   ------------------------------------------------------------------------------------------------------------------  Chemistries   Recent Labs Lab 06/13/15 0339  NA 138  K 3.5  CL 100*  CO2 30  GLUCOSE 151*  BUN 10  CREATININE 0.56  CALCIUM 9.2  AST 17  ALT 8*  ALKPHOS 75  BILITOT 0.8   ------------------------------------------------------------------------------------------------------------------  Cardiac Enzymes  Recent Labs Lab 06/13/15 0339  TROPONINI <0.03   ------------------------------------------------------------------------------------------------------------------  RADIOLOGY:  Dg Chest Port 1 View  06/13/2015  CLINICAL DATA:  Nausea and vomiting. Hypoxia. History of pneumonia. EXAM: PORTABLE CHEST 1 VIEW COMPARISON:  None. FINDINGS: The cardiac silhouette appears moderately enlarged. Mediastinal silhouette is nonsuspicious. Elevated RIGHT hemidiaphragm. No pleural effusion or focal consolidation. No pneumothorax. Old RIGHT posterior rib fractures. Osteopenia. Soft tissue planes are nonsuspicious. IMPRESSION: Moderate cardiomegaly, no acute pulmonary process. Electronically Signed   By: Awilda Metro M.D.   On: 06/13/2015 04:51    EKG:   Orders placed or performed during the hospital encounter of 06/13/15  . EKG 12-Lead  . EKG 12-Lead    ASSESSMENT AND PLAN:   This is an 81 year old Caucasian female admitted for GI bleed and Afib with RVR. # GI bleed: likely upper GI bleed as she is presenting with Hematemesis and melena.  Aggressive IV  hydration.  IV Protonix. Monitor hemoglobin and hematocrit every 6 hours and transfuse as needed  Aspirin held.  Gastroenterology consult placed.  NPO   # Intractable nausea: symptomatic care with antiemetics     # Afib with RVR: the patient has received a total of 10 mg Diltiazem IV , rate controlled Consult cardiology Will provide IV Lopressor as needed basis in the meanwhile as patient is nothing by mouth at this time Cannot consider anticoagulants secondary to active GI bleed  #. Essential hypertension: hold home medications , provide IV Lopressor as needed   # Hypothyroidism: cont Synthroid  # Depression: continue Celexa and olanzipine  # DVT prophylaxis: SCD's  # GI prophylaxis: ppi     All the records are reviewed and case discussed with Care Management/Social Workerr. Management plans discussed with the patient,  and her son at bedside and they are in agreement.  CODE STATUS: DNR  TOTAL TIME TAKING CARE OF THIS PATIENT: 35 minutes.   POSSIBLE D/C IN 3-4  DAYS, DEPENDING ON CLINICAL CONDITION.   Ramonita LabGouru, Priscilla Finklea M.D on 06/13/2015 at 3:02 PM  Between 7am to 6pm - Pager - [phone] After 6pm go to [email or web address] - password [email or web address] [facility]  [facility]  Office  [phone]  CC: Primary care physician; No primary care provider on file.

## 2015-06-14 ENCOUNTER — Inpatient Hospital Stay: Payer: Medicare (Managed Care)

## 2015-06-14 ENCOUNTER — Encounter: Payer: Self-pay | Admitting: Gastroenterology

## 2015-06-14 DIAGNOSIS — R1084 Generalized abdominal pain: Secondary | ICD-10-CM

## 2015-06-14 DIAGNOSIS — K2961 Other gastritis with bleeding: Secondary | ICD-10-CM

## 2015-06-14 DIAGNOSIS — R109 Unspecified abdominal pain: Secondary | ICD-10-CM | POA: Insufficient documentation

## 2015-06-14 LAB — BASIC METABOLIC PANEL
Anion gap: 6 (ref 5–15)
BUN: 11 mg/dL (ref 6–20)
CHLORIDE: 101 mmol/L (ref 101–111)
CO2: 30 mmol/L (ref 22–32)
CREATININE: 0.66 mg/dL (ref 0.44–1.00)
Calcium: 8.5 mg/dL — ABNORMAL LOW (ref 8.9–10.3)
GFR calc non Af Amer: >60 mL/min (ref >60)
Glucose, Bld: 127 mg/dL — ABNORMAL HIGH (ref 65–99)
Potassium: 3.1 mmol/L — ABNORMAL LOW (ref 3.5–5.1)
Sodium: 137 mmol/L (ref 135–145)

## 2015-06-14 LAB — CBC
HEMATOCRIT: 47.1 % — AB (ref 35.0–47.0)
HEMOGLOBIN: 15.2 g/dL (ref 12.0–16.0)
MCH: 31 pg (ref 26.0–34.0)
MCHC: 32.2 g/dL (ref 32.0–36.0)
MCV: 96.2 fL (ref 80.0–100.0)
Platelets: 189 10*3/uL (ref 150–440)
RBC: 4.89 MIL/uL (ref 3.80–5.20)
RDW: 12.8 % (ref 11.5–14.5)
WBC: 15.3 10*3/uL — ABNORMAL HIGH (ref 3.6–11.0)

## 2015-06-14 LAB — HEMOGLOBIN AND HEMATOCRIT, BLOOD
HCT: 43.9 % (ref 35.0–47.0)
HEMATOCRIT: 44.8 % (ref 35.0–47.0)
Hemoglobin: 14.2 g/dL (ref 12.0–16.0)
Hemoglobin: 14.4 g/dL (ref 12.0–16.0)

## 2015-06-14 MED ORDER — MENTHOL 3 MG MT LOZG
1.0000 | LOZENGE | OROMUCOSAL | Status: DC | PRN
Start: 2015-06-14 — End: 2015-06-17
  Administered 2015-06-14 (×3): 3 mg via ORAL
  Filled 2015-06-14: qty 9

## 2015-06-14 MED ORDER — KCL IN DEXTROSE-NACL 20-5-0.9 MEQ/L-%-% IV SOLN
INTRAVENOUS | Status: DC
  Administered 2015-06-14 – 2015-06-15 (×3): via INTRAVENOUS
  Filled 2015-06-14 (×5): qty 1000

## 2015-06-14 NOTE — Care Management (Signed)
Patient admitted with GI bleed. Patient presents from the [facility].  Plan is for [facility].  CSW is following.

## 2015-06-14 NOTE — Progress Notes (Signed)
While doing hourly rounding it was noted pt. Had removed her IV and NG tube, both were replaced. Pt and son were explained in detail the need of leaving these itmes intact untile the doctor instructs other wise. Will continue to monitor pt.

## 2015-06-14 NOTE — Progress Notes (Signed)
Xray has now called with result and stated NG tube is still coiled in the hiatal hernia. Will pass on to night shift to pull current NG tube and reinsert. Patient is aware and is okay with doing another tube. Keeps stating her throat is very sore, but she is willing to do it for the benefits of draining her stomach.

## 2015-06-14 NOTE — Progress Notes (Signed)
NG tube at nare is 70 and PIV is 22g to right dorsal hand

## 2015-06-14 NOTE — Progress Notes (Signed)
NG tube is no longer draining green bile. According to radiologist, tube is coiled in the hiatal hernia. Recommendation is to insert the tube farther and repeat the xray to get the tube past the hernia. Pulled back on NG tube and reinserted further, but patient was unable to tolerate is farther down. Pulled out some and contents started to suction again, a clear color with slight pink to it. Tube is still at 70cm, but was pulled back and reinserted to attempt to uncoil. Notified Dr. Amado CoeGouru and MD stated that if this xray does not show it is fixed, we will have to start over and reinsert the tube. Will continue to monitor.

## 2015-06-14 NOTE — Clinical Social Work Note (Signed)
CSW informed by the RCC: Amber at [facility] that they will be able to take patient back at discharge. York SpanielMonica Varnika Butz MSW,LCSW [phone]

## 2015-06-14 NOTE — Progress Notes (Signed)
Subjective: The patient reports pain today. The NG tube is now draining bile without any blood. The EGD showed gastric outlet obstruction with bleeding from repeated vomiting. It also showed a large hiatal hernia.    Objective: Vital signs in last 24 hours: Filed Vitals:   06/13/15 1810 06/13/15 1932 06/14/15 0518 06/14/15 1115  BP: 116/90 118/105 128/67 140/74  Pulse: 101 99 69 91  Temp:  99.6 F (37.6 C) 99 F (37.2 C) 99.5 F (37.5 C)  TempSrc:  Oral Oral Oral  Resp: 26 20 20 20   Height:      Weight:   260 lb 12.8 oz (118.298 kg)   SpO2: 91% 90% 93% 92%   Weight change: -11 lb (-4.99 kg)  Intake/Output Summary (Last 24 hours) at 06/14/15 1548 Last data filed at 06/14/15 1518  Gross per 24 hour  Intake 3422.5 ml  Output    500 ml  Net 2922.5 ml     Exam: Regular rate and rhythm clear to auscultation Abdomen Less tender today with less distention   Lab Results: @LABTEST2 @ Micro Results: No results found for this or any previous visit (from the past 240 hour(s)). Studies/Results: Dg Chest Port 1 View  06/13/2015  CLINICAL DATA:  Nausea and vomiting. Hypoxia. History of pneumonia. EXAM: PORTABLE CHEST 1 VIEW COMPARISON:  None. FINDINGS: The cardiac silhouette appears moderately enlarged. Mediastinal silhouette is nonsuspicious. Elevated RIGHT hemidiaphragm. No pleural effusion or focal consolidation. No pneumothorax. Old RIGHT posterior rib fractures. Osteopenia. Soft tissue planes are nonsuspicious. IMPRESSION: Moderate cardiomegaly, no acute pulmonary process. Electronically Signed   By: Awilda Metroourtnay  Bloomer M.D.   On: 06/13/2015 04:51   Medications: I have reviewed the patient's current medications. Scheduled Meds: . amLODipine  10 mg Oral Daily  . citalopram  20 mg Oral Daily  . diltiazem  30 mg Oral 3 times per day  . ferrous sulfate  325 mg Oral Q breakfast  . gabapentin  300 mg Oral TID  . levothyroxine  175 mcg Oral QAC breakfast  . OLANZapine  5 mg Oral  Daily  . polyethylene glycol  17 g Oral Daily  . senna-docusate  1 tablet Oral BID  . sodium chloride  3 mL Intravenous Q12H  . traMADol  50 mg Oral QID  . traZODone  100 mg Oral QHS  . vitamin B-12  1,000 mcg Oral Daily  . Vitamin D (Ergocalciferol)  50,000 Units Oral Q7 days   Continuous Infusions: . dextrose 5 % and 0.9 % NaCl with KCl 20 mEq/L 125 mL/hr at 06/14/15 1325  . pantoprozole (PROTONIX) infusion 8 mg/hr (06/14/15 1215)   PRN Meds:.acetaminophen **OR** acetaminophen, cloNIDine, dextrose, ipratropium-albuterol, menthol-cetylpyridinium, metoprolol, ondansetron **OR** ondansetron (ZOFRAN) IV, ondansetron, promethazine   Assessment: Active Problems:   GI bleed   Emesis    Plan: Continue NG to LIS. Clamp tomorrow to see if patient tolerates it. If she does then advance diet slowly. If she does not then surgery consult.   LOS: 1 day   Daren Teige Rountree 06/14/2015, 3:48 PM

## 2015-06-14 NOTE — Consult Note (Signed)
WOC wound consult note Reason for Consult: Unnas boots to bilateral lower extremities due to chronic venous insufficiency.  Patient has them changed weekly on Wednesday and is requesting to stay on that schedule.  States that her legs are intact, no open wounds, just compression.  Her son is at the bedside and confirms this.  We will wait and change Unaas boots tomorrow.  Dressing procedure/placement/frequency: Cleanse bilateral legs with soap and water and pat gently dry. Wrap legs with zinc layer and secure with Coban.  Change weekly on Wednesday.  WOC team will continue to follow.  Maple HudsonKaren Maximilliano Kersh RN BSN CWON Pager [phone]

## 2015-06-14 NOTE — Progress Notes (Signed)
NG tube was not properly secured to patient's nose with tape. NG tube slid in placement about 5cm. Was able to push tube back to 70cm and re-tape with proper NG securement tape. Unable to auscultate placement. Three other nurses tried as well and were unable to auscultate. Tube has been draining dark green bile that has now turned lighter in color. Drained 150cc so far. When suction was hooked back up after re-inserting slightly, green drainage still coming out with continuous low suction. Patient is already scheduled for an abdominal xray for another reason. Called xray and they confirmed patient can come down for xray in about 15 minutes. Will verify placement with xray as well.

## 2015-06-14 NOTE — Clinical Social Work Note (Signed)
Clinical Social Work Assessment  Patient Details  Name: Tracey Yates MRN: [id number] Date of Birth: 02/28/1932  Date of referral:  06/14/15               Reason for consult:  Facility Placement                Permission sought to share information with:  Family Supports, Chartered certified accountant granted to share information::  Yes, Verbal Permission Granted  Name::        Agency::     Relationship::     Contact Information:     Housing/Transportation Living arrangements for the past 2 months:  [place] of Information:  Patient, Adult Children Patient Interpreter Needed:  None Criminal Activity/Legal Involvement Pertinent to Current Situation/Hospitalization:  No - Comment as needed Significant Relationships:  Adult Children Lives with:  Facility Resident Do you feel safe going back to the place where you live?  Yes Need for family participation in patient care:  No (Coment)  Care giving concerns:  Patient resides at [facility]   Social Worker assessment / plan:  CSW met with patient and her son this afternoon and confirmed that patient is a resident of [facility]. Patient wishes to return there when time. Patient currently with ng tube and son very knowledgeable about the MD plan to attempt to clamp the ng tube in the next 1 to 2 days and try a diet. CSW to contact [facility] to ensure that patient can return to them at discharge.  Employment status:  Retired Nurse, adult PT Recommendations:  Not assessed at this time Information / Referral to community resources:     Patient/Family's Response to care:  Patient and son appreciative for CSW visit.  Patient/Family's Understanding of and Emotional Response to Diagnosis, Current Treatment, and Prognosis:  Patient's son is aware of current medical plan for patient and in agreement for return to [facility] when time.  Emotional Assessment Appearance:  Appears stated  age Attitude/Demeanor/Rapport:   (pleasant and cooperative) Affect (typically observed):  Pleasant Orientation:  Oriented to Self, Oriented to Place Alcohol / Substance use:  Not Applicable Psych involvement (Current and /or in the community):  No (Comment)  Discharge Needs  Concerns to be addressed:  No discharge needs identified Readmission within the last 30 days:  No Current discharge risk:  None Barriers to Discharge:  No Barriers Identified   Shela Leff, LCSW 06/14/2015, 2:26 PM

## 2015-06-14 NOTE — Progress Notes (Signed)
[facility] [place] [facility]   PATIENT NAME: Tracey Yates    MR#:  [id number]  DATE OF BIRTH:  [birth year]  SUBJECTIVE:  CHIEF COMPLAINT:  Patient had EGD yesterday,nausea and abdominal distention and better after nG tube placement her son at bedside.  improved epigastric abdominal pain  REVIEW OF SYSTEMS:  CONSTITUTIONAL: No fever, fatigue or weakness.  EYES: No blurred or double vision.  EARS, NOSE, AND THROAT: No tinnitus or ear pain.  RESPIRATORY: No cough, shortness of breath, wheezing or hemoptysis.  CARDIOVASCULAR: No chest pain, orthopnea, edema.  GASTROINTESTINAL: nausearesolved, coffee-ground vomiting, denies diarrhea  , reports improved epigastric abdominal pain. Positive flatus, last bowel movement 2 days ago GENITOURINARY: No dysuria, hematuria.  ENDOCRINE: No polyuria, nocturia,  HEMATOLOGY: No anemia, easy bruising or bleeding SKIN: No rash or lesion. MUSCULOSKELETAL: No joint pain or arthritis.   NEUROLOGIC: No tingling, numbness, weakness.  PSYCHIATRY: No anxiety or depression.   DRUG ALLERGIES:   Allergies  Allergen Reactions  . Codeine Other (See Comments)    Reaction: unknown  . Nsaids Other (See Comments)    Reaction: unknown  . Valium [Diazepam] Other (See Comments)    Reaction: unknown    VITALS:  Blood pressure 140/74, pulse 91, temperature 99.5 F (37.5 C), temperature source Oral, resp. rate 20, height  (1.6 m), weight 118.298 kg (260 lb 12.8 oz), SpO2 92 %.  PHYSICAL EXAMINATION:  GENERAL:  81 y.o.-year-old patient lying in the bed with no acute distress.  EYES: Pupils equal, round, reactive to light and accommodation. No scleral icterus. Extraocular muscles intact.  HEENT: Head atraumatic, normocephalic. Oropharynx and nasopharynx clear. NG tube is intact NECK:  Supple, no jugular venous distention. No thyroid enlargement, no tenderness.  LUNGS: Normal breath sounds bilaterally, no wheezing, rales,rhonchi or  crepitation. No use of accessory muscles of respiration.  CARDIOVASCULAR: S1, S2 normal. No murmurs, rubs, or gallops.  ABDOMEN: Soft, obese, epigastric tenderness, no rebound tenderness,nondistended . Bowel sounds present.  EXTREMITIES: No pedal edema, cyanosis, or clubbing.  NEUROLOGIC: arousable but lethargic  PSYCHIATRIC: The patient is alert and oriented x 1-2 SKIN: No obvious rash, lesion, or ulcer.    LABORATORY PANEL:   CBC  Recent Labs Lab 06/14/15 0331  WBC 15.3*  HGB 15.2  HCT 47.1*  PLT 189   ------------------------------------------------------------------------------------------------------------------  Chemistries   Recent Labs Lab 06/13/15 0339 06/14/15 0331  NA 138 137  K 3.5 3.1*  CL 100* 101  CO2 30 30  GLUCOSE 151* 127*  BUN 10 11  CREATININE 0.56 0.66  CALCIUM 9.2 8.5*  AST 17  --   ALT 8*  --   ALKPHOS 75  --   BILITOT 0.8  --    ------------------------------------------------------------------------------------------------------------------  Cardiac Enzymes  Recent Labs Lab 06/13/15 0339  TROPONINI <0.03   ------------------------------------------------------------------------------------------------------------------  RADIOLOGY:  Dg Chest Port 1 View  06/13/2015  CLINICAL DATA:  Nausea and vomiting. Hypoxia. History of pneumonia. EXAM: PORTABLE CHEST 1 VIEW COMPARISON:  None. FINDINGS: The cardiac silhouette appears moderately enlarged. Mediastinal silhouette is nonsuspicious. Elevated RIGHT hemidiaphragm. No pleural effusion or focal consolidation. No pneumothorax. Old RIGHT posterior rib fractures. Osteopenia. Soft tissue planes are nonsuspicious. IMPRESSION: Moderate cardiomegaly, no acute pulmonary process. Electronically Signed   By: Awilda Metro M.D.   On: 06/13/2015 04:51    EKG:   Orders placed or performed during the hospital encounter of 06/13/15  . EKG 12-Lead  . EKG 12-Lead    ASSESSMENT AND PLAN:  This  is an 81 year old Caucasian female admitted for GI bleed and Afib with RVR. # GI bleed:  upper GI bleed secondary to Mallory-Weiss tear with Sheria Langameron ulcers and hiatal hernia Status post EGD Appreciate GI recommendations Continue nothing by mouth and IV fluids Continue NG tube for decompression for the next 1-2 days and monitor  clinical condition  IV Protonix. Monitor hemoglobin and hematocrit every 6 hours and transfuse as needed  Aspirin held.    # Intractable nausea:  Improved with NG symptomatic care with antiemetics     # Afib with RVR: the patient has received a total of 10 mg Diltiazem IV , rate controlled Consult cardiology Will provide IV Lopressor as needed basis in the meanwhile as patient is nothing by mouth at this time Cannot consider anticoagulants secondary to active GI bleed  #. Essential hypertension: hold home medications , provide IV Lopressor as needed   # Hypothyroidism: cont Synthroid  # Depression: continue Celexa and olanzipine  # DVT prophylaxis: SCD's  # GI prophylaxis: ppi     All the records are reviewed and case discussed with Care Management/Social Workerr. Management plans discussed with the patient,  and her son at bedside and they are in agreement.  CODE STATUS: DNR  TOTAL TIME TAKING CARE OF THIS PATIENT: 35 minutes.   POSSIBLE D/C IN 3-4  DAYS, DEPENDING ON CLINICAL CONDITION.   Ramonita LabGouru, Calayah Guadarrama M.D on 06/14/2015 at 3:03 PM  Between 7am to 6pm - Pager - [phone] After 6pm go to [email or web address] - password [email or web address] [facility]  [facility]  Office  [phone]  CC: Primary care physician; No primary care provider on file.

## 2015-06-15 ENCOUNTER — Inpatient Hospital Stay: Payer: Medicare (Managed Care)

## 2015-06-15 LAB — CBC
HCT: 44.6 % (ref 35.0–47.0)
Hemoglobin: 14.7 g/dL (ref 12.0–16.0)
MCH: 32.4 pg (ref 26.0–34.0)
MCHC: 33.1 g/dL (ref 32.0–36.0)
MCV: 98 fL (ref 80.0–100.0)
PLATELETS: 151 10*3/uL (ref 150–440)
RBC: 4.55 MIL/uL (ref 3.80–5.20)
RDW: 12.7 % (ref 11.5–14.5)
WBC: 10.1 10*3/uL (ref 3.6–11.0)

## 2015-06-15 LAB — BASIC METABOLIC PANEL
ANION GAP: 6 (ref 5–15)
BUN: 9 mg/dL (ref 6–20)
CALCIUM: 8.4 mg/dL — AB (ref 8.9–10.3)
CO2: 32 mmol/L (ref 22–32)
CREATININE: 0.61 mg/dL (ref 0.44–1.00)
Chloride: 104 mmol/L (ref 101–111)
GFR calc Af Amer: >60 mL/min (ref >60)
GLUCOSE: 134 mg/dL — AB (ref 65–99)
Potassium: 3.3 mmol/L — ABNORMAL LOW (ref 3.5–5.1)
Sodium: 142 mmol/L (ref 135–145)

## 2015-06-15 MED ORDER — FUROSEMIDE 10 MG/ML IJ SOLN
40.0000 mg | INTRAMUSCULAR | Status: AC
  Administered 2015-06-15: 40 mg via INTRAVENOUS
  Filled 2015-06-15: qty 4

## 2015-06-15 MED ORDER — DILTIAZEM HCL ER COATED BEADS 120 MG PO CP24
120.0000 mg | ORAL_CAPSULE | Freq: Every day | ORAL | Status: DC
  Administered 2015-06-16 – 2015-06-17 (×2): 120 mg via ORAL
  Filled 2015-06-15 (×2): qty 1

## 2015-06-15 MED ORDER — POTASSIUM CHLORIDE 10 MEQ/100ML IV SOLN
10.0000 meq | INTRAVENOUS | Status: AC
  Administered 2015-06-15 (×4): 10 meq via INTRAVENOUS
  Filled 2015-06-15 (×4): qty 100

## 2015-06-15 MED ORDER — GUAIFENESIN 100 MG/5ML PO SOLN
10.0000 mL | ORAL | Status: DC | PRN
  Administered 2015-06-15 – 2015-06-17 (×4): 200 mg via ORAL
  Filled 2015-06-15 (×3): qty 10
  Filled 2015-06-15: qty 50

## 2015-06-15 NOTE — Progress Notes (Signed)
[facility]  SUBJECTIVE: I feel better   Filed Vitals:   06/14/15 2026 06/15/15 0509 06/15/15 1212 06/15/15 1300  BP: 123/64 155/68 144/76   Pulse: 93 86 86   Temp: 98.1 F (36.7 C) 98.7 F (37.1 C) 98.1 F (36.7 C)   TempSrc: Oral Oral Oral   Resp: 22 22 24    Height:      Weight:  119.976 kg (264 lb 8 oz)    SpO2:  91% 84% 92%     Intake/Output Summary (Last 24 hours) at 06/15/15 1323 Last data filed at 06/15/15 1322  Gross per 24 hour  Intake 4170.42 ml  Output    400 ml  Net 3770.42 ml      PHYSICAL EXAM  General: Well developed, well nourished, in no acute distress HEENT:  Normocephalic and atramatic Neck:  No JVD.  Lungs: Clear bilaterally to auscultation and percussion. Heart: HRRR . Normal S1 and S2 without gallops or murmurs.  Abdomen: Bowel sounds are positive, abdomen soft and non-tender  Msk:  Back normal, normal gait. Normal strength and tone for age. Extremities: No clubbing, cyanosis or edema.   Neuro: Alert and oriented X 3. Psych:  Good affect, responds appropriately   LABS: Basic Metabolic Panel:  Recent Labs  16/04/9610/22/16 0331 06/15/15 0507  NA 137 142  K 3.1* 3.3*  CL 101 104  CO2 30 32  GLUCOSE 127* 134*  BUN 11 9  CREATININE 0.66 0.61  CALCIUM 8.5* 8.4*   Liver Function Tests:  Recent Labs  06/13/15 0339  AST 17  ALT 8*  ALKPHOS 75  BILITOT 0.8  PROT 7.5  ALBUMIN 4.1    Recent Labs  06/13/15 0339  LIPASE 20   CBC:  Recent Labs  06/13/15 0339  06/14/15 0331  06/14/15 2155 06/15/15 0507  WBC 9.6  --  15.3*  --   --  10.1  NEUTROABS 8.3*  --   --   --   --   --   HGB 17.9*  < > 15.2  < > 14.4 14.7  HCT 53.8*  < > 47.1*  < > 44.8 44.6  MCV 96.2  --  96.2  --   --  98.0  PLT 187  --  189  --   --  151  < > = values in this interval not displayed. Cardiac Enzymes:  Recent Labs  06/13/15 0339  TROPONINI <0.03   BNP: Invalid input(s): POCBNP D-Dimer: No results for input(s): DDIMER in the last 72  hours. Hemoglobin A1C:  Recent Labs  06/13/15 0339  HGBA1C 6.0   Fasting Lipid Panel: No results for input(s): CHOL, HDL, LDLCALC, TRIG, CHOLHDL, LDLDIRECT in the last 72 hours. Thyroid Function Tests:  Recent Labs  06/13/15 0339  TSH 1.438   Anemia Panel: No results for input(s): VITAMINB12, FOLATE, FERRITIN, TIBC, IRON, RETICCTPCT in the last 72 hours.  Dg Abd 1 View  06/14/2015  CLINICAL DATA:  NG tube placement EXAM: ABDOMEN - 1 VIEW COMPARISON:  06/14/2015 FINDINGS: NG tube projects over the lower chest appearing to be within a large hiatal hernia. Compared to earlier today, the tube has been retracted and is no longer coiled in the hernia. Nonspecific gaseous distention of the bowel. No obstruction pattern. IMPRESSION: NG tube remains within a large hiatal hernia. Electronically Signed   By: Judie PetitM.  Shick M.D.   On: 06/14/2015 21:27   Dg Abd 1 View  06/14/2015  CLINICAL DATA:  Patient status post NG  tube placement. EXAM: ABDOMEN - 1 VIEW COMPARISON:  Chest radiograph 06/13/2015. FINDINGS: Enteric tube tip and side-port are coiled at the level of mediastinum. This may potentially be located within a large hiatal hernia which is not definitive on current evaluation. Marked gaseous distention of multiple loops of colon. Heterogeneous opacities left lung base. Large amount of stool within the rectum. Lower thoracic and lumbar spine degenerative changes. IMPRESSION: Enteric tube tip and side-port project at the level of mediastinum, malpositioned. It is unclear if this is potentially located within a large hiatal hernia. Recommend repositioning. Multiple gaseous distended loops of colon may be secondary to ileus. There is a large amount distal stool within the rectum as can be seen with constipation. These results will be called to the ordering clinician or representative by the Radiologist Assistant, and communication documented in the PACS or zVision Dashboard. Electronically Signed   By:  Annia Belt M.D.   On: 06/14/2015 16:30   Dg Abd 2 Views  06/15/2015  CLINICAL DATA:  Nausea and vomiting. EXAM: ABDOMEN - 2 VIEW COMPARISON:  06/14/2015. FINDINGS: NG tube no longer visualized. Large hiatal hernia again noted. Interim progression of colonic dilatation. Findings most likely related to ileus. Follow-up abdominal series suggested to exclude continued bowel distention/obstruction . No free air. Pelvic calcifications consistent with phleboliths. Degenerative changes lumbar spine. IMPRESSION: 1. NG tube no longer visualized. 2. Mild progression of colonic distention. Follow-up abdominal series suggested to demonstrate resolution. Electronically Signed   By: Maisie Fus  Register   On: 06/15/2015 08:09   Dg Abd Portable 1v  06/14/2015  CLINICAL DATA:  Patient status post enteric tube placement. EXAM: PORTABLE ABDOMEN - 1 VIEW COMPARISON:  Earlier same day. FINDINGS: The enteric tube appears coiled within the lower chest, likely within a large hiatal hernia. The tip is directed inferiorly. Re- demonstrated marked gaseous distention of multiple loops of colon within the abdomen. Large amount of stool within the rectum. Heterogeneous opacities bilateral lung bases with probable small left pleural effusion. IMPRESSION: NG tube appears coiled within the lower thorax, likely within a large hiatal hernia. Recommend clinical correlation. Consider repositioning as clinically indicated. These results will be called to the ordering clinician or representative by the Radiologist Assistant, and communication documented in the PACS or zVision Dashboard. Electronically Signed   By: Annia Belt M.D.   On: 06/14/2015 18:40     Echo   TELEMETRY: Sinus rhythm:  ASSESSMENT AND PLAN:  Active Problems:   GI bleed   Emesis   Abdominal pain    1. Paroxysmal atrial fibrillation, currently in sinus rhythm, triggered by intractable nausea and GI bleeding  Recommendations  1. Continue current therapy 2.  Convert to long-acting Cardizem prior to discharge 3. Defer chronic anticoagulation  Signed off for now, please call if any questions   Donnis Pecha, MD, PhD, [facility] 06/15/2015 1:23 PM

## 2015-06-15 NOTE — Consult Note (Signed)
WOC wound consult note Reason for Consult:Unnas boots application/wound assessment Wound type:Chronic venous insufficiency.  [facility] and has weekly compression garment changes on Wednesday.   Pressure Ulcer POA: N/A Measurement: Left lower extremity, dorsal calf just above foot  2 cm x 4.2 cm intact erythema.  Dry, scaly skin noted.  Patient states this area is tender.  Skin is intact.  Right dorsal calf, just above foot 3 cm x 4 cm intact erythema 2 cm x 2 cm erythema to right posterior calf and patient states this area is tender.  Wound ZOX:WRUEAVbed:intact dry scaly skin Drainage (amount, consistency, odor)None noted  Periwound:Erythema, dry scaly skin  Edematous bilateral lower legs.  Dressing procedure/placement/frequency:Cleanse bilateral legs with soap and water and pat gently dry.  Apply zinc layer, secured with Coban.  Change every Wednesday.   WOC team will follow.  Maple HudsonKaren Neima Lacross RN BSN CWON Pager [phone]

## 2015-06-15 NOTE — Progress Notes (Signed)
Notified physician that patient has a new onset of crackles in lungs bilaterally. Patient spo2 84 1 LNC. Increased pt oxygen to 3 LNC patient spo2 at 92. Potassium 3.3.  New orders received.

## 2015-06-15 NOTE — Progress Notes (Signed)
PT Cancellation Note  Patient Details Name: Tracey Yates MRN: [id number] DOB: 10/17/1931   Cancelled Treatment:    Reason Eval/Treat Not Completed: Patient declined, no reason specified. Patient asked PT to let her "rest a while" before attempting mobility evaluation. PT will re-attempt at a later date/time.   Kerin RansomPatrick A McNamara, PT, DPT    06/15/2015, 11:34 AM

## 2015-06-15 NOTE — Care Management Important Message (Signed)
Important Message  Patient Details  Name: Tracey Yates MRN: [id number] Date of Birth: 02/28/1932   Medicare Important Message Given:  Yes    Olegario MessierKathy A Bynum Mccullars 06/15/2015, 10:26 AM

## 2015-06-15 NOTE — Progress Notes (Signed)
Patient ID: Tracey Lacyneida Smeltzer, female   DOB: 10/22/1931, 81 y.o.   MRN: [id number] [facility] [place] [facility]   PATIENT NAME: Tracey Yates    MR#:  [id number]  DATE OF BIRTH:  08/30/1931  SUBJECTIVE:  nausea  REVIEW OF SYSTEMS:   Review of Systems  Constitutional: Negative for fever, chills and weight loss.  HENT: Negative for ear discharge, ear pain and nosebleeds.   Eyes: Negative for blurred vision, pain and discharge.  Respiratory: Negative for sputum production, shortness of breath, wheezing and stridor.   Cardiovascular: Negative for chest pain, palpitations, orthopnea and PND.  Gastrointestinal: Positive for nausea. Negative for vomiting, abdominal pain and diarrhea.  Genitourinary: Negative for urgency and frequency.  Musculoskeletal: Negative for back pain and joint pain.  Neurological: Negative for sensory change, speech change, focal weakness and weakness.  Psychiatric/Behavioral: Negative for depression and hallucinations. The patient is not nervous/anxious.    Tolerating Diet:CLD Tolerating PT: not yet  DRUG ALLERGIES:   Allergies  Allergen Reactions  . Codeine Other (See Comments)    Reaction: unknown  . Nsaids Other (See Comments)    Reaction: unknown  . Valium [Diazepam] Other (See Comments)    Reaction: unknown    VITALS:  Blood pressure 144/76, pulse 86, temperature 98.1 F (36.7 C), temperature source Oral, resp. rate 24, height 5\' 3"  (1.6 m), weight 119.976 kg (264 lb 8 oz), SpO2 92 %.  PHYSICAL EXAMINATION:   Physical Exam  GENERAL:  81 y.o.-year-old patient lying in the bed with no acute distress. obese EYES: Pupils equal, round, reactive to light and accommodation. No scleral icterus. Extraocular muscles intact.  HEENT: Head atraumatic, normocephalic. Oropharynx and nasopharynx clear.  NECK:  Supple, no jugular venous distention. No thyroid enlargement, no tenderness.  LUNGS: Normal breath sounds bilaterally, no  wheezing, rales, rhonchi. No use of accessory muscles of respiration.  CARDIOVASCULAR: S1, S2 normal. No murmurs, rubs, or gallops.  ABDOMEN: Soft, nontender, nondistended. Bowel sounds present. No organomegaly or mass.  EXTREMITIES: No cyanosis, clubbing or edema b/l.    NEUROLOGIC: Cranial nerves II through XII are intact. No focal Motor or sensory deficits b/l.   PSYCHIATRIC: The patient is alert and oriented x 3.  SKIN: No obvious rash, lesion, or ulcer.    LABORATORY PANEL:   CBC  Recent Labs Lab 06/15/15 0507  WBC 10.1  HGB 14.7  HCT 44.6  PLT 151    Chemistries   Recent Labs Lab 06/13/15 0339  06/15/15 0507  NA 138  < > 142  K 3.5  < > 3.3*  CL 100*  < > 104  CO2 30  < > 32  GLUCOSE 151*  < > 134*  BUN 10  < > 9  CREATININE 0.56  < > 0.61  CALCIUM 9.2  < > 8.4*  AST 17  --   --   ALT 8*  --   --   ALKPHOS 75  --   --   BILITOT 0.8  --   --   < > = values in this interval not displayed.  Cardiac Enzymes  Recent Labs Lab 06/13/15 0339  TROPONINI <0.03    RADIOLOGY:  Dg Abd 1 View  06/14/2015  CLINICAL DATA:  NG tube placement EXAM: ABDOMEN - 1 VIEW COMPARISON:  06/14/2015 FINDINGS: NG tube projects over the lower chest appearing to be within a large hiatal hernia. Compared to earlier today, the tube has been retracted and is no longer  coiled in the hernia. Nonspecific gaseous distention of the bowel. No obstruction pattern. IMPRESSION: NG tube remains within a large hiatal hernia. Electronically Signed   By: Judie Petit.  Shick M.D.   On: 06/14/2015 21:27   Dg Abd 1 View  06/14/2015  CLINICAL DATA:  Patient status post NG tube placement. EXAM: ABDOMEN - 1 VIEW COMPARISON:  Chest radiograph 06/13/2015. FINDINGS: Enteric tube tip and side-port are coiled at the level of mediastinum. This may potentially be located within a large hiatal hernia which is not definitive on current evaluation. Marked gaseous distention of multiple loops of colon. Heterogeneous opacities  left lung base. Large amount of stool within the rectum. Lower thoracic and lumbar spine degenerative changes. IMPRESSION: Enteric tube tip and side-port project at the level of mediastinum, malpositioned. It is unclear if this is potentially located within a large hiatal hernia. Recommend repositioning. Multiple gaseous distended loops of colon may be secondary to ileus. There is a large amount distal stool within the rectum as can be seen with constipation. These results will be called to the ordering clinician or representative by the Radiologist Assistant, and communication documented in the PACS or zVision Dashboard. Electronically Signed   By: Annia Belt M.D.   On: 06/14/2015 16:30   Dg Abd 2 Views  06/15/2015  CLINICAL DATA:  Nausea and vomiting. EXAM: ABDOMEN - 2 VIEW COMPARISON:  06/14/2015. FINDINGS: NG tube no longer visualized. Large hiatal hernia again noted. Interim progression of colonic dilatation. Findings most likely related to ileus. Follow-up abdominal series suggested to exclude continued bowel distention/obstruction . No free air. Pelvic calcifications consistent with phleboliths. Degenerative changes lumbar spine. IMPRESSION: 1. NG tube no longer visualized. 2. Mild progression of colonic distention. Follow-up abdominal series suggested to demonstrate resolution. Electronically Signed   By: Maisie Fus  Register   On: 06/15/2015 08:09   Dg Abd Portable 1v  06/14/2015  CLINICAL DATA:  Patient status post enteric tube placement. EXAM: PORTABLE ABDOMEN - 1 VIEW COMPARISON:  Earlier same day. FINDINGS: The enteric tube appears coiled within the lower chest, likely within a large hiatal hernia. The tip is directed inferiorly. Re- demonstrated marked gaseous distention of multiple loops of colon within the abdomen. Large amount of stool within the rectum. Heterogeneous opacities bilateral lung bases with probable small left pleural effusion. IMPRESSION: NG tube appears coiled within the lower  thorax, likely within a large hiatal hernia. Recommend clinical correlation. Consider repositioning as clinically indicated. These results will be called to the ordering clinician or representative by the Radiologist Assistant, and communication documented in the PACS or zVision Dashboard. Electronically Signed   By: Annia Belt M.D.   On: 06/14/2015 18:40   ASSESSMENT AND PLAN:   81 year old Caucasian female admitted for GI bleed and Afib with RVR.  # GI bleed: upper GI bleed secondary to Mallory-Weiss tear with Sheria Lang ulcers and hiatal hernia Status post EGD Appreciate GI recommendations. There is possibility of Gastric outlet obstruction CLD Po PPI Aspirin held.  # Intractable nausea: better prn antiemetics   # Afib with RVR:  -change to po Cardizem CD -no anticoagulation due to GI bleed  #. Essential hypertension: -on po CCB  # Hypothyroidism: cont Synthroid  # Depression: continue Celexa and olanzipine  # DVT prophylaxis: SCD's  # GI prophylaxis: ppi   Case discussed with Care Management/Social Worker. Management plans discussed with the patient, family and they are in agreement.  CODE STATUS: DNR  DVT Prophylaxis: SCD  TOTAL TIME TAKING CARE OF  THIS PATIENT: 30 minutes.  >50% time spent on counselling and coordination of care  POSSIBLE D/C IN 1-2 DAYS, DEPENDING ON CLINICAL CONDITION.   Harriet Bollen M.D on 06/15/2015 at 5:12 PM  Between 7am to 6pm - Pager - [phone]  After 6pm go to [email or web address] - password [email or web address] [facility]  [place] [facility]  Office  [phone]  CC: Primary care physician; No primary care provider on file.

## 2015-06-15 NOTE — Anesthesia Postprocedure Evaluation (Signed)
Anesthesia Post Note  Patient: Tracey Yates  Procedure(s) Performed: Procedure(s) (LRB): ESOPHAGOGASTRODUODENOSCOPY (EGD) WITH PROPOFOL (N/A)  Patient location during evaluation: Nursing Unit Anesthesia Type: General Level of consciousness: awake and alert Pain management: pain level controlled Vital Signs Assessment: vitals unstable Respiratory status: spontaneous breathing Postop Assessment: No headache Anesthetic complications: no    Last Vitals:  Filed Vitals:   06/14/15 2026 06/15/15 0509  BP: 123/64 155/68  Pulse: 93 86  Temp: 36.7 C 37.1 C  Resp: 22 22    Last Pain:  Filed Vitals:   06/15/15 0536  PainSc: 0-No pain                 Emersen Mascari S

## 2015-06-15 NOTE — Progress Notes (Signed)
Patient's NG tube was pulled and replaced. X-ray confirmed that NG tube was coiled up in the hiatal hernia . NG was discontinued per Dr. Clint GuyHower. Patient remained hemodynamically stable, denied pain , N&V, but received PRN med for non productive cough. Cough was relieved after cough med, patient rested for most of the night

## 2015-06-16 NOTE — Progress Notes (Signed)
PT Cancellation Note  Patient Details Name: Tracey Yates MRN: [id number] DOB: 09/26/1931   Cancelled Treatment:    Reason Eval/Treat Not Completed: Patient declined, no reason specified. Patient adamantly refuses any attempts at mobility as she is "too worn out and can't walk". PT offered several times and patient continued with her declines. PT will re-attempt as able.   Kerin RansomPatrick A Eriona Kinchen, PT, DPT    06/16/2015, 12:53 PM

## 2015-06-16 NOTE — Plan of Care (Signed)
Problem: Activity: Goal: Risk for activity intolerance will decrease Outcome: Progressing amulated briefly in hall, although she did desat to 88% on 4 liters and HR increased to 122

## 2015-06-16 NOTE — Progress Notes (Signed)
Patient ID: Tracey Yates, female   DOB: 06/22/1932, 81 y.o.   MRN: [id number] [facility] [place] [facility]   PATIENT NAME: Tracey Yates    MR#:  [id number]  DATE OF BIRTH:  04/14/1932  SUBJECTIVE:  Tolerating CLD Wants to go back to her AL today  REVIEW OF SYSTEMS:   Review of Systems  Constitutional: Negative for fever, chills and weight loss.  HENT: Negative for ear discharge, ear pain and nosebleeds.   Eyes: Negative for blurred vision, pain and discharge.  Respiratory: Negative for sputum production, shortness of breath, wheezing and stridor.   Cardiovascular: Negative for chest pain, palpitations, orthopnea and PND.  Gastrointestinal: Positive for nausea. Negative for vomiting, abdominal pain and diarrhea.  Genitourinary: Negative for urgency and frequency.  Musculoskeletal: Negative for back pain and joint pain.  Neurological: Negative for sensory change, speech change, focal weakness and weakness.  Psychiatric/Behavioral: Negative for depression and hallucinations. The patient is not nervous/anxious.    Tolerating Diet:CLD Tolerating PT: not yet  DRUG ALLERGIES:   Allergies  Allergen Reactions  . Codeine Other (See Comments)    Reaction: unknown  . Nsaids Other (See Comments)    Reaction: unknown  . Valium [Diazepam] Other (See Comments)    Reaction: unknown    VITALS:  Blood pressure 133/77, pulse 76, temperature 99.1 F (37.3 C), temperature source Oral, resp. rate 18, height 5\' 3"  (1.6 m), weight 119.341 kg (263 lb 1.6 oz), SpO2 96 %.  PHYSICAL EXAMINATION:   Physical Exam  GENERAL:  81 y.o.-year-old patient lying in the bed with no acute distress. obese EYES: Pupils equal, round, reactive to light and accommodation. No scleral icterus. Extraocular muscles intact.  HEENT: Head atraumatic, normocephalic. Oropharynx and nasopharynx clear.  NECK:  Supple, no jugular venous distention. No thyroid enlargement, no tenderness.  LUNGS:  Normal breath sounds bilaterally, no wheezing, rales, rhonchi. No use of accessory muscles of respiration.  CARDIOVASCULAR: S1, S2 normal. No murmurs, rubs, or gallops.  ABDOMEN: Soft, nontender, nondistended. Bowel sounds present. No organomegaly or mass.  EXTREMITIES: No cyanosis, clubbing or edema b/l.    NEUROLOGIC: Cranial nerves II through XII are intact. No focal Motor or sensory deficits b/l.   PSYCHIATRIC: The patient is alert and oriented x 3.  SKIN: No obvious rash, lesion, or ulcer.    LABORATORY PANEL:   CBC  Recent Labs Lab 06/15/15 0507  WBC 10.1  HGB 14.7  HCT 44.6  PLT 151    Chemistries   Recent Labs Lab 06/13/15 0339  06/15/15 0507  NA 138  < > 142  K 3.5  < > 3.3*  CL 100*  < > 104  CO2 30  < > 32  GLUCOSE 151*  < > 134*  BUN 10  < > 9  CREATININE 0.56  < > 0.61  CALCIUM 9.2  < > 8.4*  AST 17  --   --   ALT 8*  --   --   ALKPHOS 75  --   --   BILITOT 0.8  --   --   < > = values in this interval not displayed.  Cardiac Enzymes  Recent Labs Lab 06/13/15 0339  TROPONINI <0.03    RADIOLOGY:  Dg Abd 1 View  06/14/2015  CLINICAL DATA:  NG tube placement EXAM: ABDOMEN - 1 VIEW COMPARISON:  06/14/2015 FINDINGS: NG tube projects over the lower chest appearing to be within a large hiatal hernia. Compared to earlier today,  the tube has been retracted and is no longer coiled in the hernia. Nonspecific gaseous distention of the bowel. No obstruction pattern. IMPRESSION: NG tube remains within a large hiatal hernia. Electronically Signed   By: Judie Petit.  Shick M.D.   On: 06/14/2015 21:27   Dg Abd 1 View  06/14/2015  CLINICAL DATA:  Patient status post NG tube placement. EXAM: ABDOMEN - 1 VIEW COMPARISON:  Chest radiograph 06/13/2015. FINDINGS: Enteric tube tip and side-port are coiled at the level of mediastinum. This may potentially be located within a large hiatal hernia which is not definitive on current evaluation. Marked gaseous distention of multiple  loops of colon. Heterogeneous opacities left lung base. Large amount of stool within the rectum. Lower thoracic and lumbar spine degenerative changes. IMPRESSION: Enteric tube tip and side-port project at the level of mediastinum, malpositioned. It is unclear if this is potentially located within a large hiatal hernia. Recommend repositioning. Multiple gaseous distended loops of colon may be secondary to ileus. There is a large amount distal stool within the rectum as can be seen with constipation. These results will be called to the ordering clinician or representative by the Radiologist Assistant, and communication documented in the PACS or zVision Dashboard. Electronically Signed   By: Annia Belt M.D.   On: 06/14/2015 16:30   Dg Abd 2 Views  06/15/2015  CLINICAL DATA:  Nausea and vomiting. EXAM: ABDOMEN - 2 VIEW COMPARISON:  06/14/2015. FINDINGS: NG tube no longer visualized. Large hiatal hernia again noted. Interim progression of colonic dilatation. Findings most likely related to ileus. Follow-up abdominal series suggested to exclude continued bowel distention/obstruction . No free air. Pelvic calcifications consistent with phleboliths. Degenerative changes lumbar spine. IMPRESSION: 1. NG tube no longer visualized. 2. Mild progression of colonic distention. Follow-up abdominal series suggested to demonstrate resolution. Electronically Signed   By: Maisie Fus  Register   On: 06/15/2015 08:09   Dg Abd Portable 1v  06/14/2015  CLINICAL DATA:  Patient status post enteric tube placement. EXAM: PORTABLE ABDOMEN - 1 VIEW COMPARISON:  Earlier same day. FINDINGS: The enteric tube appears coiled within the lower chest, likely within a large hiatal hernia. The tip is directed inferiorly. Re- demonstrated marked gaseous distention of multiple loops of colon within the abdomen. Large amount of stool within the rectum. Heterogeneous opacities bilateral lung bases with probable small left pleural effusion. IMPRESSION: NG  tube appears coiled within the lower thorax, likely within a large hiatal hernia. Recommend clinical correlation. Consider repositioning as clinically indicated. These results will be called to the ordering clinician or representative by the Radiologist Assistant, and communication documented in the PACS or zVision Dashboard. Electronically Signed   By: Annia Belt M.D.   On: 06/14/2015 18:40   ASSESSMENT AND PLAN:   81 year old Caucasian female admitted for GI bleed and Afib with RVR.  # GI bleed: upper GI bleed secondary to Mallory-Weiss tear with Sheria Lang ulcers and hiatal hernia Status post EGD Appreciate GI recommendations. There is possibility of Gastric outlet obstruction CLD--->FLD Po PPI Aspirin held. -intermittent hypoxia. Assess for home oxygen use.  # Intractable nausea: better prn antiemetics   # Afib with RVR:  -change to po Cardizem CD -no anticoagulation due to GI bleed  #. Essential hypertension: -on po CCB  # Hypothyroidism: cont Synthroid  # Depression: continue Celexa and olanzipine  # DVT prophylaxis: SCD's  # GI prophylaxis: ppi   Case discussed with Care Management/Social Worker. Management plans discussed with the patient, family and they are  in agreement.  CODE STATUS: DNR  DVT Prophylaxis: SCD  TOTAL TIME TAKING CARE OF THIS PATIENT: 30 minutes.  >50% time spent on counselling and coordination of care  POSSIBLE D/C IN 1-2 DAYS, DEPENDING ON CLINICAL CONDITION.   Justene Jensen M.D on 06/16/2015 at 12:29 PM  Between 7am to 6pm - Pager - [phone]  After 6pm go to [email or web address] - password [email or web address] [facility]  [place] [facility]  Office  [phone]  CC: Primary care physician; No primary care provider on file.

## 2015-06-16 NOTE — Evaluation (Signed)
Physical Therapy Evaluation Patient Details Name: Tracey Yates MRN: [id number] DOB: 05/06/1932 Today's Date: 06/16/2015   History of Present Illness  Patient is an 81 y/o female that presents with recurrent coffee ground emesis and guaiac positiive tarry stools. Has had EGD and NG tube placed at different times on this admission  Clinical Impression  Patient agrees to work with PT on this session and demonstrates roughly her baseline level of bed mobility and transfers. She is limited in ambulation secondary to significant deconditioning as evidenced by her increase in HR from mid 70s to mid 120s after short bout of ambulation. Her O2 sats remained above 88% on 4L of O2, however her respiratory rate and effort increased significantly with short bout of ambulation. She did not display any loss of balance during ambulation, but given the above should be considered a high falls risk. She will require assistance for longer mobility currently and would benefit from HHPT to increase her endurance and work on higher level balance activities. Skilled PT services are indicated to address the above mobility deficits.     Follow Up Recommendations Home health PT    Equipment Recommendations       Recommendations for Other Services       Precautions / Restrictions Precautions Precautions: Fall Restrictions Weight Bearing Restrictions: No      Mobility  Bed Mobility Overal bed mobility: Needs Assistance Bed Mobility: Supine to Sit     Supine to sit: Min guard;Min assist     General bed mobility comments: Patient cued to use hand rails, otherwise she is able to bring her LEs off the EOB. Minimal if any assistance required to assist her trunk to upright sitting position.   Transfers Overall transfer level: Needs assistance Equipment used: Rolling walker (2 wheeled) Transfers: Sit to/from Stand Sit to Stand: Min guard         General transfer comment: Patient demonstrates appropriate  speed, though requires cuing for safe hand placement in transfer.   Ambulation/Gait Ambulation/Gait assistance: Supervision;Min guard Ambulation Distance (Feet): 50 Feet Assistive device: Rolling walker (2 wheeled) Gait Pattern/deviations: WFL(Within Functional Limits)   Gait velocity interpretation: <1.8 ft/sec, indicative of risk for recurrent falls General Gait Details: Patient instructed to keep her walking closer to her COM during ambulation and turns. No over loss of balance noted, nor any drifting/swaying noted. This appears closer to her baseline.   Stairs            Wheelchair Mobility    Modified Rankin (Stroke Patients Only)       Balance Overall balance assessment: Needs assistance Sitting-balance support: Feet supported;Bilateral upper extremity supported Sitting balance-Leahy Scale: Good     Standing balance support: Bilateral upper extremity supported Standing balance-Leahy Scale: Fair Standing balance comment: Patient is able to complete turns, though at times her LEs come outside the RW and requires cuing for safe foot placement.                              Pertinent Vitals/Pain Pain Assessment:  (Pt reports no acute onset pain in this session. )    Home Living Family/patient expects to be discharged to:: Assisted living               Home Equipment: Walker - 2 wheels      Prior Function Level of Independence: Independent with assistive device(s)         Comments: Pt reports she has  been ambulating to and from the dining hall with RW.      Hand Dominance        Extremity/Trunk Assessment   Upper Extremity Assessment: Overall WFL for tasks assessed           Lower Extremity Assessment: Overall WFL for tasks assessed (No deficits noted bilaterally)         Communication   Communication: No difficulties  Cognition Arousal/Alertness: Awake/alert Behavior During Therapy: Flat affect Overall Cognitive Status:  Within Functional Limits for tasks assessed (Appears to be at her baseline?)                      General Comments General comments (skin integrity, edema, etc.): Bilateral LE dressings in place.     Exercises        Assessment/Plan    PT Assessment Patient needs continued PT services  PT Diagnosis Difficulty walking;Generalized weakness   PT Problem List Decreased strength;Decreased mobility;Decreased safety awareness;Cardiopulmonary status limiting activity;Decreased balance;Decreased activity tolerance  PT Treatment Interventions DME instruction;Therapeutic activities;Therapeutic exercise;Gait training;Balance training   PT Goals (Current goals can be found in the Care Plan section) Acute Rehab PT Goals Patient Stated Goal: To go home tomorrow  PT Goal Formulation: With patient Time For Goal Achievement: 06/30/15 Potential to Achieve Goals: Fair    Frequency Min 2X/week   Barriers to discharge        Co-evaluation               End of Session Equipment Utilized During Treatment: Gait belt;Oxygen Activity Tolerance: Patient limited by fatigue Patient left: in chair;with chair alarm set (RN staff alerted no alarm boxes found patient in chair) Nurse Communication: Mobility status         Time: 1401-1420 PT Time Calculation (min) (ACUTE ONLY): 19 min   Charges:   PT Evaluation $Initial PT Evaluation Tier I: 1 Procedure     PT G Codes:       Kerin Ransom, PT, DPT    06/16/2015, 3:24 PM

## 2015-06-16 NOTE — Clinical Social Work Note (Signed)
CSW received call from MD that pt was requesting to return to [facility]. CSW spoke with supervisor in charge at facility who reports that they are unable to accept pt back on holiday. MD updated.   Derenda FennelKara Satoshi Kalas, LCSW [phone]

## 2015-06-17 ENCOUNTER — Encounter: Payer: Self-pay | Admitting: Internal Medicine

## 2015-06-17 MED ORDER — OMEPRAZOLE 20 MG PO CPDR: 40.0000 mg | DELAYED_RELEASE_CAPSULE | Freq: Two times a day (BID) | ORAL | Status: AC

## 2015-06-17 MED ORDER — DILTIAZEM HCL ER COATED BEADS 120 MG PO CP24: 120.0000 mg | ORAL_CAPSULE | Freq: Every day | ORAL | Status: AC

## 2015-06-17 NOTE — Discharge Instructions (Signed)
Soft diet, small and frequent meals

## 2015-06-17 NOTE — Care Management (Signed)
Patient to be discharged today back to [facility]. Patient was already open [facility] and RN.  Resumption of home health order and face to face completed.  Patient requiring acute O2 at time of discharge.  Qualifying sats documented, order for O2 placed.  Judeth CornfieldStephanie from [facility] notified. RNCM signing off

## 2015-06-17 NOTE — Discharge Summary (Signed)
[facility] - [place] [facility]   PATIENT NAME: Tracey Yates    MR#:  [id number]  DATE OF BIRTH:  1932/05/08  DATE OF ADMISSION:  06/13/2015 ADMITTING PHYSICIAN: Arnaldo Natal, MD  DATE OF DISCHARGE: 06/17/15  PRIMARY CARE PHYSICIAN: No primary care provider on file.    ADMISSION DIAGNOSIS:  Dehydration [E86.0] Upper GI bleed [K92.2] Atrial fibrillation with rapid ventricular response (HCC) [I48.91] Non-intractable vomiting with nausea, vomiting of unspecified type [R11.2]  DISCHARGE DIAGNOSIS:  GI bleed due to MW tear, cameron lesions Afib with RVR Hypoxia now in oxygen  SECONDARY DIAGNOSIS:   Past Medical History  Diagnosis Date  . Dementia   . Major depression (HCC)   . GERD (gastroesophageal reflux disease)   . Coronary artery disease   . Hiatal hernia   . Overactive bladder   . Ventral hernia   . Hypertension   . Anemia   . Cardiomegaly   . Recurrent UTI   . Vitamin B deficiency   . Hypothyroidism   . Arthritis     HOSPITAL COURSE:   81 year old Caucasian female admitted for GI bleed and Afib with RVR.  # GI bleed: upper GI bleed secondary to Mallory-Weiss tear with Sheria Lang ulcers and hiatal hernia Status post EGD Appreciate GI recommendations. There is possibility of Gastric outlet obstruction CLD--->FLD-->toleraiting soft diet  Po PPI Aspirin held. -intermittent hypoxia. Will set up for home oxygen use and wean her off when appropriate.  # Intractable nausea: better prn antiemetics   # Afib with RVR:  -change to po Cardizem CD -no anticoagulation due to GI bleed  #. Essential hypertension: -on po CCB  # Hypothyroidism: cont Synthroid  # Depression: continue Celexa and olanzipine  # DVT prophylaxis: SCD's  # GI prophylaxis: ppi HHPT per PT recommendations D/c back to [facility] CONSULTS OBTAINED:  Treatment Team:  Ramonita Lab, MD Midge Minium, MD Marcina Millard, MD  DRUG ALLERGIES:    Allergies  Allergen Reactions  . Codeine Other (See Comments)    Reaction: unknown  . Nsaids Other (See Comments)    Reaction: unknown  . Valium [Diazepam] Other (See Comments)    Reaction: unknown    DISCHARGE MEDICATIONS:   Current Discharge Medication List    START taking these medications   Details  diltiazem (CARDIZEM CD) 120 MG 24 hr capsule Take 1 capsule (120 mg total) by mouth daily. Qty: 30 capsule, Refills: 2      CONTINUE these medications which have CHANGED   Details  omeprazole (PRILOSEC) 20 MG capsule Take 2 capsules (40 mg total) by mouth 2 (two) times daily before a meal. Qty: 60 capsule, Refills: 2      CONTINUE these medications which have NOT CHANGED   Details  !! acetaminophen (TYLENOL) 500 MG tablet Take 500 mg by mouth 4 (four) times daily.    !! acetaminophen (TYLENOL) 500 MG tablet Take 500 mg by mouth every 4 (four) hours as needed for mild pain or moderate pain.    citalopram (CELEXA) 20 MG tablet Take 20 mg by mouth daily.    cloNIDine (CATAPRES) 0.1 MG tablet Take 0.1 mg by mouth as needed (blood pressure >160/90).    dextrose (GLUTOSE) 40 % GEL Take 1 Tube by mouth as needed for low blood sugar.    ferrous sulfate 325 (65 FE) MG tablet Take 325 mg by mouth daily with breakfast.    gabapentin (NEURONTIN) 300 MG capsule Take 300 mg by mouth 3 (  three) times daily.    ipratropium-albuterol (DUONEB) 0.5-2.5 (3) MG/3ML SOLN Take 3 mLs by nebulization every 4 (four) hours as needed (wheezing/ shortness of breath).    levothyroxine (SYNTHROID, LEVOTHROID) 175 MCG tablet Take 175 mcg by mouth daily before breakfast.    NON FORMULARY Take 1 Dose by mouth 2 (two) times daily. Green Tic Tac given d/t constipation White Tic Tac given d/t diarrhea One of each given twice a day    OLANZapine (ZYPREXA) 5 MG tablet Take 5 mg by mouth daily.    ondansetron (ZOFRAN) 4 MG tablet Take 4 mg by mouth every 8 (eight) hours as needed for nausea or  vomiting.    polyethylene glycol (MIRALAX / GLYCOLAX) packet Take 17 g by mouth daily.    senna-docusate (SENOKOT-S) 8.6-50 MG tablet Take 1 tablet by mouth 2 (two) times daily.    traMADol (ULTRAM) 50 MG tablet Take 50 mg by mouth 4 (four) times daily.    traZODone (DESYREL) 100 MG tablet Take 100 mg by mouth at bedtime.    vitamin B-12 (CYANOCOBALAMIN) 1000 MCG tablet Take 1,000 mcg by mouth daily.    Vitamin D, Ergocalciferol, (DRISDOL) 50000 UNITS CAPS capsule Take 50,000 Units by mouth every 7 (seven) days.     !! - Potential duplicate medications found. Please discuss with provider.    STOP taking these medications     amLODipine (NORVASC) 10 MG tablet      aspirin EC 81 MG tablet         If you experience worsening of your admission symptoms, develop shortness of breath, life threatening emergency, suicidal or homicidal thoughts you must seek medical attention immediately by calling 911 or calling your MD immediately  if symptoms less severe.  You Must read complete instructions/literature along with all the possible adverse reactions/side effects for all the Medicines you take and that have been prescribed to you. Take any new Medicines after you have completely understood and accept all the possible adverse reactions/side effects.   Please note  You were cared for by a hospitalist during your hospital stay. If you have any questions about your discharge medications or the care you received while you were in the hospital after you are discharged, you can call the unit and asked to speak with the hospitalist on call if the hospitalist that took care of you is not available. Once you are discharged, your primary care physician will handle any further medical issues. Please note that NO REFILLS for any discharge medications will be authorized once you are discharged, as it is imperative that you return to your primary care physician (or establish a relationship with a primary care  physician if you do not have one) for your aftercare needs so that they can reassess your need for medications and monitor your lab values. Today   SUBJECTIVE  Doing well   VITAL SIGNS:  Blood pressure 127/68, pulse 62, temperature 98.1 F (36.7 C), temperature source Oral, resp. rate 18, height  (1.6 m), weight 118.525 kg (261 lb 4.8 oz), SpO2 93 %.  I/O:   Intake/Output Summary (Last 24 hours) at 06/17/15 1050 Last data filed at 06/17/15 1042  Gross per 24 hour  Intake    760 ml  Output      0 ml  Net    760 ml    PHYSICAL EXAMINATION:  GENERAL:  81 y.o.-year-old patient lying in the bed with no acute distress.  EYES: Pupils equal, round, reactive to  light and accommodation. No scleral icterus. Extraocular muscles intact.  HEENT: Head atraumatic, normocephalic. Oropharynx and nasopharynx clear.  NECK:  Supple, no jugular venous distention. No thyroid enlargement, no tenderness.  LUNGS: Normal breath sounds bilaterally, no wheezing, rales,rhonchi or crepitation. No use of accessory muscles of respiration.  CARDIOVASCULAR: S1, S2 normal. No murmurs, rubs, or gallops.  ABDOMEN: Soft, non-tender, non-distended. Bowel sounds present. No organomegaly or mass.  EXTREMITIES: No pedal edema, cyanosis, or clubbing.  NEUROLOGIC: Cranial nerves II through XII are intact. Muscle strength 5/5 in all extremities. Sensation intact. Gait not checked.  PSYCHIATRIC: The patient is alert and oriented x 3.  SKIN: No obvious rash, lesion, or ulcer.   DATA REVIEW:   CBC   Recent Labs Lab 06/15/15 0507  WBC 10.1  HGB 14.7  HCT 44.6  PLT 151    Chemistries   Recent Labs Lab 06/13/15 0339  06/15/15 0507  NA 138  < > 142  K 3.5  < > 3.3*  CL 100*  < > 104  CO2 30  < > 32  GLUCOSE 151*  < > 134*  BUN 10  < > 9  CREATININE 0.56  < > 0.61  CALCIUM 9.2  < > 8.4*  AST 17  --   --   ALT 8*  --   --   ALKPHOS 75  --   --   BILITOT 0.8  --   --   < > = values in this interval not  displayed.  Microbiology Results   No results found for this or any previous visit (from the past 240 hour(s)).  RADIOLOGY:  No results found.   Management plans discussed with the patient, family and they are in agreement.  CODE STATUS:     Code Status Orders        Start     Ordered   06/13/15 1052  Do not attempt resuscitation (DNR)   Continuous    Question Answer Comment  In the event of cardiac or respiratory ARREST Do not call a "code blue"   In the event of cardiac or respiratory ARREST Do not perform Intubation, CPR, defibrillation or ACLS   In the event of cardiac or respiratory ARREST Use medication by any route, position, wound care, and other measures to relive pain and suffering. May use oxygen, suction and manual treatment of airway obstruction as needed for comfort.      06/13/15 1051    Advance Directive Documentation        Most Recent Value   Type of Advance Directive  Out of facility DNR (pink MOST or yellow form)   Pre-existing out of facility DNR order (yellow form or pink MOST form)  Yellow form placed in chart (order not valid for inpatient use)   "MOST" Form in Place?        TOTAL TIME TAKING CARE OF THIS PATIENT: 40 minutes.    Alfreda Hammad M.D on 06/17/2015 at 10:50 AM  Between 7am to 6pm - Pager - [phone] After 6pm go to [email or web address] - password [email or web address] [facility]  [facility]  Office  [phone]  CC: Primary care physician; No primary care provider on file.

## 2015-06-17 NOTE — Care Management Important Message (Signed)
Important Message  Patient Details  Name: Tracey Yates MRN: [id number] Date of Birth: 05/24/1932   Medicare Important Message Given:  Yes    Chapman FitchBOWEN, Zanyiah Posten T, RN 06/17/2015, 9:01 AM

## 2015-06-17 NOTE — Progress Notes (Signed)
Pt de- saturated to 81% on room air while at rest. She is 93-94% on 4 litres n.c.

## 2015-06-17 NOTE — NC FL2 (Signed)
[place] MEDICAID FL2 LEVEL OF CARE SCREENING TOOL     IDENTIFICATION  Patient Name: Tracey Yates Birthdate: [birth year] Sex: female Admission Date (Current Location): 06/13/2015  [facility] and [place] Number: Chiropodist and Address:  [facility], [address], [place], [place] [address]      Provider Number: [id number]  Attending Physician Name and Address:  Enedina Finner, MD  Relative Name and Phone Number:       Current Level of Care: Hospital Recommended Level of Care: Assisted Living Facility Prior Approval Number:    Date Approved/Denied:   PASRR Number:    Discharge Plan:  (ALF)    Current Diagnoses: Patient Active Problem List   Diagnosis Date Noted  . Abdominal pain   . GI bleed 06/13/2015  . Emesis     Orientation ACTIVITIES/SOCIAL BLADDER RESPIRATION    Self, Place  Active Incontinent O2 (As needed)  BEHAVIORAL SYMPTOMS/MOOD NEUROLOGICAL BOWEL NUTRITION STATUS   (none)  (none) Continent Diet  PHYSICIAN VISITS COMMUNICATION OF NEEDS Height & Weight Skin  30 days Verbally   260 lbs. Normal          AMBULATORY STATUS RESPIRATION    Supervision limited O2 (As needed)      Personal Care Assistance Level of Assistance  Bathing, Dressing Bathing Assistance: Limited assistance   Dressing Assistance: Limited assistance      Functional Limitations Info                SPECIAL CARE FACTORS FREQUENCY   (Home Health )                   Additional Factors Info  Code Status Code Status Info: DNR             Current Medications (06/17/2015): Current Facility-Administered Medications  Medication Dose Route Frequency Provider Last Rate Last Dose  . acetaminophen (TYLENOL) tablet 650 mg  650 mg Oral Q6H PRN Arnaldo Natal, MD       Or  . acetaminophen (TYLENOL) suppository 650 mg  650 mg Rectal Q6H PRN Arnaldo Natal, MD      . citalopram (CELEXA) tablet 20 mg  20 mg Oral Daily Arnaldo Natal, MD   20 mg at 06/17/15 0945  . cloNIDine (CATAPRES) tablet 0.1 mg  0.1 mg Oral PRN Arnaldo Natal, MD      . dextrose (GLUTOSE) 40 % oral gel 37.5 g  1 Tube Oral PRN Arnaldo Natal, MD      . diltiazem (CARDIZEM CD) 24 hr capsule 120 mg  120 mg Oral Daily Enedina Finner, MD   120 mg at 06/17/15 0950  . ferrous sulfate tablet 325 mg  325 mg Oral Q breakfast Arnaldo Natal, MD   325 mg at 06/17/15 0809  . gabapentin (NEURONTIN) capsule 300 mg  300 mg Oral TID Arnaldo Natal, MD   300 mg at 06/17/15 0945  . guaiFENesin (ROBITUSSIN) 100 MG/5ML solution 200 mg  10 mL Oral Q4H PRN Wyatt Haste, MD   200 mg at 06/17/15 0945  . ipratropium-albuterol (DUONEB) 0.5-2.5 (3) MG/3ML nebulizer solution 3 mL  3 mL Nebulization Q4H PRN Arnaldo Natal, MD      . levothyroxine (SYNTHROID, LEVOTHROID) tablet 175 mcg  175 mcg Oral QAC breakfast Arnaldo Natal, MD   175 mcg at 06/17/15 0809  . menthol-cetylpyridinium (CEPACOL) lozenge 3 mg  1 lozenge Oral Q3H PRN Ramonita Lab, MD  3 mg at 06/14/15 1805  . metoprolol (LOPRESSOR) injection 5 mg  5 mg Intravenous Q4H PRN Ramonita Lab, MD      . OLANZapine (ZYPREXA) tablet 5 mg  5 mg Oral Daily Arnaldo Natal, MD   5 mg at 06/17/15 1610  . ondansetron (ZOFRAN) tablet 4 mg  4 mg Oral Q6H PRN Arnaldo Natal, MD       Or  . ondansetron [facility]) injection 4 mg  4 mg Intravenous Q6H PRN Arnaldo Natal, MD   4 mg at 06/13/15 1318  . ondansetron (ZOFRAN) tablet 4 mg  4 mg Oral Q8H PRN Arnaldo Natal, MD      . polyethylene glycol [facility] / GLYCOLAX) packet 17 g  17 g Oral Daily Arnaldo Natal, MD   17 g at 06/17/15 0945  . promethazine (PHENERGAN) injection 12.5 mg  12.5 mg Intravenous Q6H PRN Ramonita Lab, MD   12.5 mg at 06/13/15 1543  . senna-docusate (Senokot-S) tablet 1 tablet  1 tablet Oral BID Arnaldo Natal, MD   1 tablet at 06/17/15 0945  . sodium chloride 0.9 % injection 3 mL  3 mL Intravenous Q12H Arnaldo Natal, MD   3 mL at  06/16/15 2200  . traMADol (ULTRAM) tablet 50 mg  50 mg Oral QID Arnaldo Natal, MD   50 mg at 06/17/15 0945  . traZODone (DESYREL) tablet 100 mg  100 mg Oral QHS Arnaldo Natal, MD   100 mg at 06/14/15 2136  . vitamin B-12 (CYANOCOBALAMIN) tablet 1,000 mcg  1,000 mcg Oral Daily Arnaldo Natal, MD   1,000 mcg at 06/17/15 0945  . Vitamin D (Ergocalciferol) (DRISDOL) capsule 50,000 Units  50,000 Units Oral Q7 days Arnaldo Natal, MD   50,000 Units at 06/13/15 1100   Do not use this list as official medication orders. Please verify with discharge summary.  Discharge Medications:   Medication List    STOP taking these medications        amLODipine 10 MG tablet  Commonly known as:  NORVASC     aspirin EC 81 MG tablet      TAKE these medications        acetaminophen 500 MG tablet  Commonly known as:  TYLENOL  Take 500 mg by mouth 4 (four) times daily.     acetaminophen 500 MG tablet  Commonly known as:  TYLENOL  Take 500 mg by mouth every 4 (four) hours as needed for mild pain or moderate pain.     citalopram 20 MG tablet  Commonly known as:  CELEXA  Take 20 mg by mouth daily.     cloNIDine 0.1 MG tablet  Commonly known as:  CATAPRES  Take 0.1 mg by mouth as needed (blood pressure >160/90).     dextrose 40 % Gel  Commonly known as:  GLUTOSE  Take 1 Tube by mouth as needed for low blood sugar.     diltiazem 120 MG 24 hr capsule  Commonly known as:  CARDIZEM CD  Take 1 capsule (120 mg total) by mouth daily.     ferrous sulfate 325 (65 FE) MG tablet  Take 325 mg by mouth daily with breakfast.     gabapentin 300 MG capsule  Commonly known as:  NEURONTIN  Take 300 mg by mouth 3 (three) times daily.     ipratropium-albuterol 0.5-2.5 (3) MG/3ML Soln  Commonly known as:  DUONEB  Take 3 mLs by nebulization every 4 (  four) hours as needed (wheezing/ shortness of breath).     levothyroxine 175 MCG tablet  Commonly known as:  SYNTHROID, LEVOTHROID  Take 175 mcg by  mouth daily before breakfast.     NON FORMULARY  Take 1 Dose by mouth 2 (two) times daily. Green Tic Tac given d/t constipation White Tic Tac given d/t diarrhea One of each given twice a day     OLANZapine 5 MG tablet  Commonly known as:  ZYPREXA  Take 5 mg by mouth daily.     omeprazole 20 MG capsule  Commonly known as:  PRILOSEC  Take 2 capsules (40 mg total) by mouth 2 (two) times daily before a meal.     ondansetron 4 MG tablet  Commonly known as:  ZOFRAN  Take 4 mg by mouth every 8 (eight) hours as needed for nausea or vomiting.     polyethylene glycol packet  Commonly known as:  MIRALAX / GLYCOLAX  Take 17 g by mouth daily.     senna-docusate 8.6-50 MG tablet  Commonly known as:  Senokot-S  Take 1 tablet by mouth 2 (two) times daily.     traMADol 50 MG tablet  Commonly known as:  ULTRAM  Take 50 mg by mouth 4 (four) times daily.     traZODone 100 MG tablet  Commonly known as:  DESYREL  Take 100 mg by mouth at bedtime.     vitamin B-12 1000 MCG tablet  Commonly known as:  CYANOCOBALAMIN  Take 1,000 mcg by mouth daily.     Vitamin D (Ergocalciferol) 50000 UNITS Caps capsule  Commonly known as:  DRISDOL  Take 50,000 Units by mouth every 7 (seven) days.        Relevant Imaging Results:  Relevant Lab Results:  Recent Labs    Additional Information    York SpanielMonica Autymn Omlor, LCSW

## 2015-06-17 NOTE — Progress Notes (Signed)
   06/17/15 1041  Oxygen Therapy  SpO2 (!) 81 %  O2 Device Room Air (at rest)

## 2015-06-17 NOTE — Clinical Social Work Note (Signed)
Patient is to discharge today to return to [facility]. CSW contacted [facility] and spoke with Boyd Kerbsenny who stated that they did not have a supervisor in charge today but that they would take patient back today. Boyd Kerbsenny was also made aware that patient will require oxygen now at their facility and that our RN CM would arrange this. Boyd Kerbsenny stated to CSW that once the oxygen arrived, to have our nurse call her and she would transport patient. CSW spoke with patient and her son and they are both in agreement with this plan. York SpanielMonica Chrsitopher Wik MSW,LCSW [phone]

## 2015-06-17 NOTE — Progress Notes (Signed)
Pt to be discharged today. 02 to accompany pt to facilityarranged by stephanie. Iv and tele removed. Packet given to son. Pt to be transported to facility by family.

## 2016-09-12 IMAGING — CR DG ABDOMEN 2V
1 series · 4 of 4 positions shown · non-contrast
Comparison: 06/14/2015.

CLINICAL DATA: Nausea and vomiting.

EXAM:
ABDOMEN - 2 VIEW

[Series 1: dg abd 2 views · 0.14mm/px · 4 of 4 slices shown]
[im 1/4]
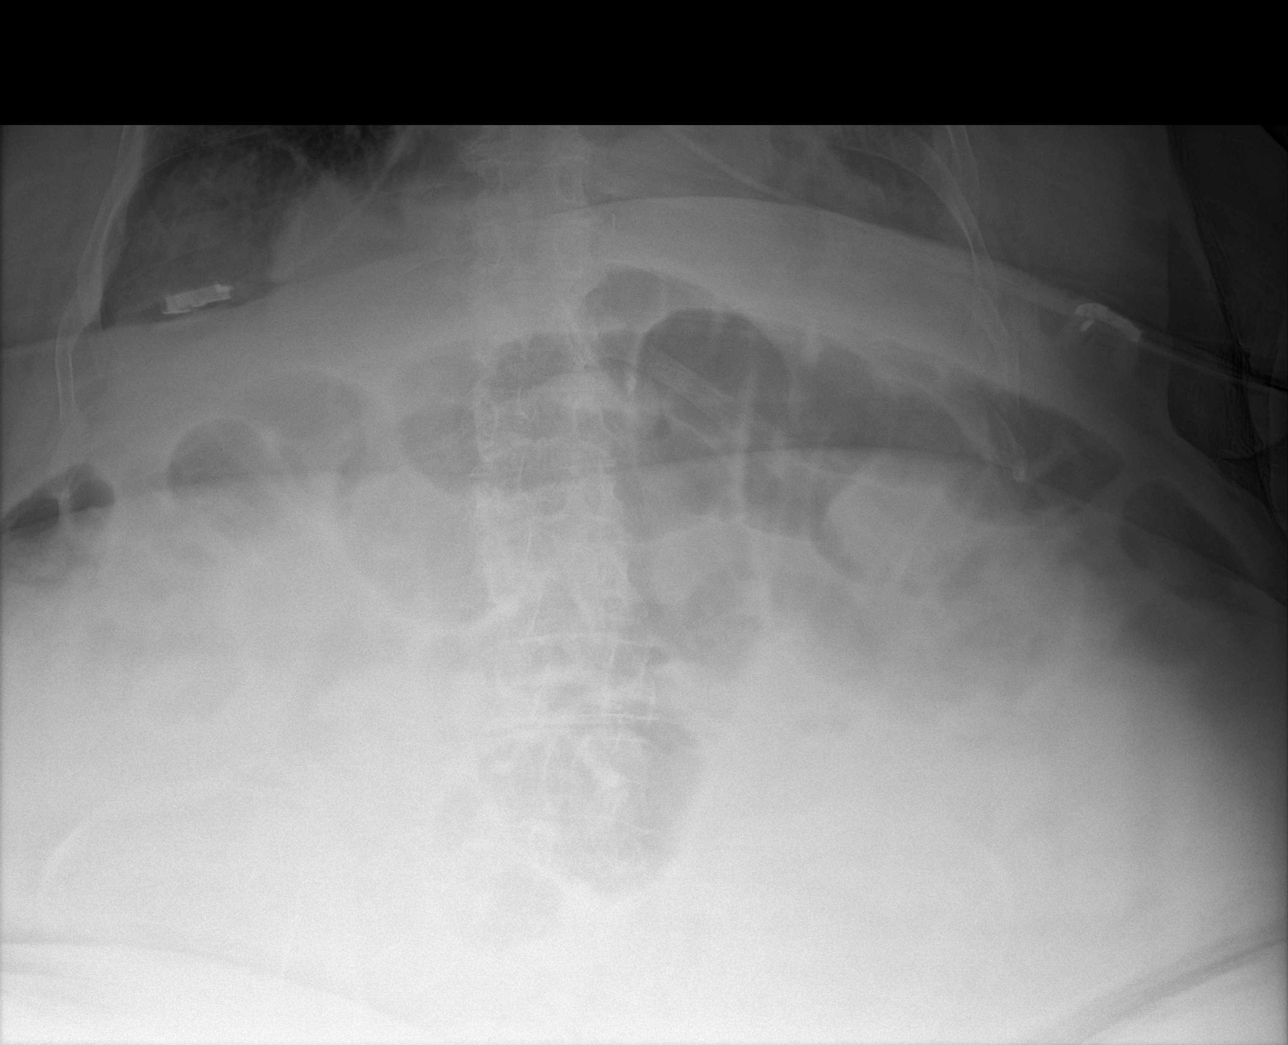
[im 2/4]
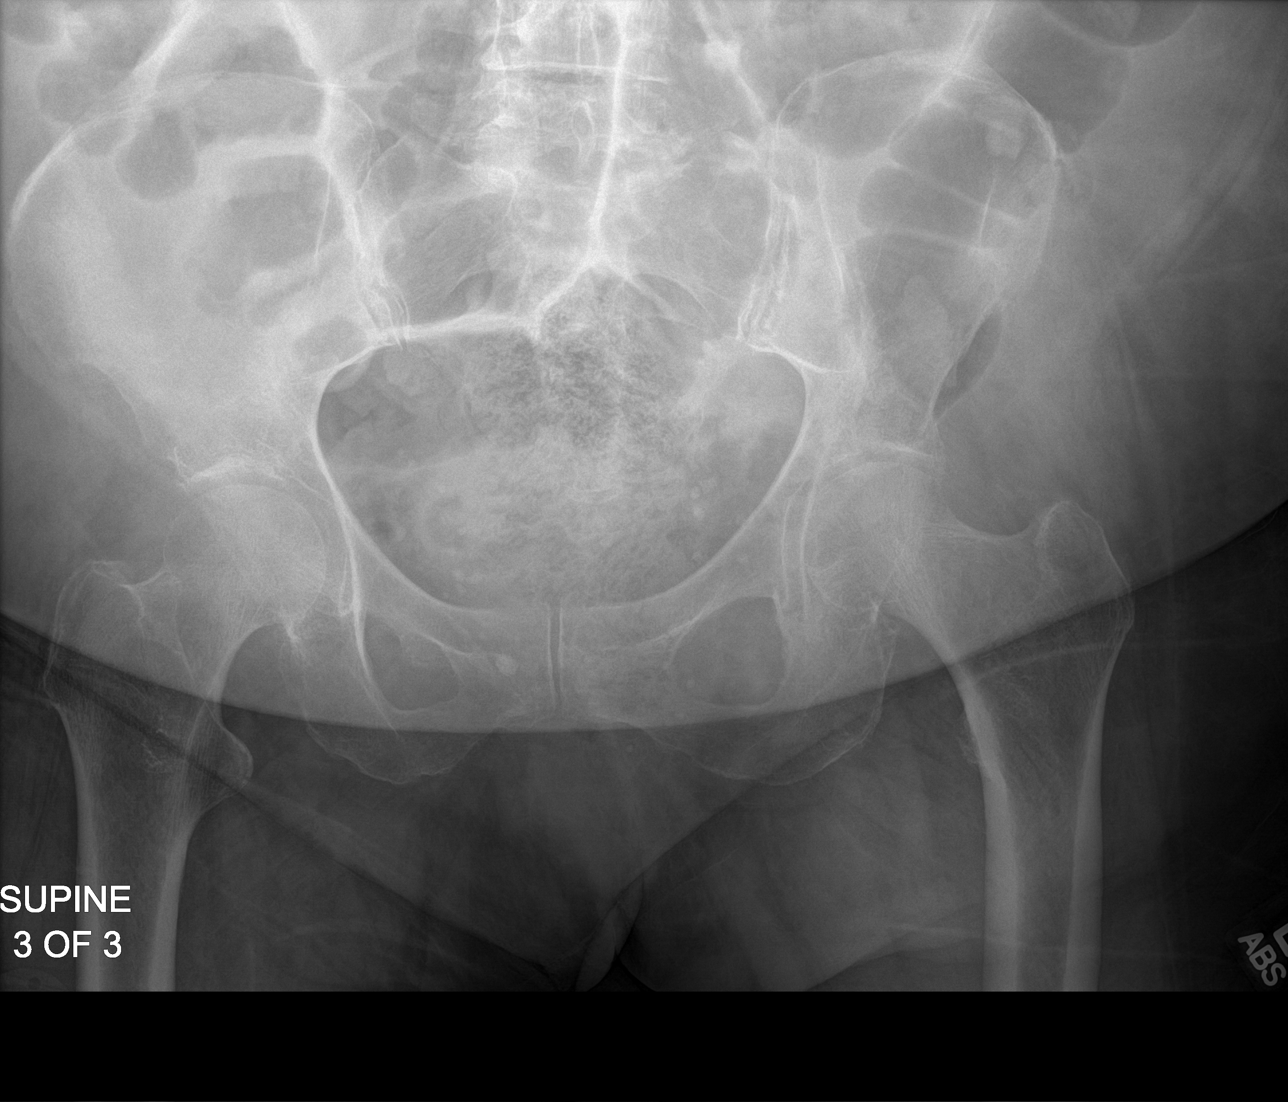
[im 3/4]
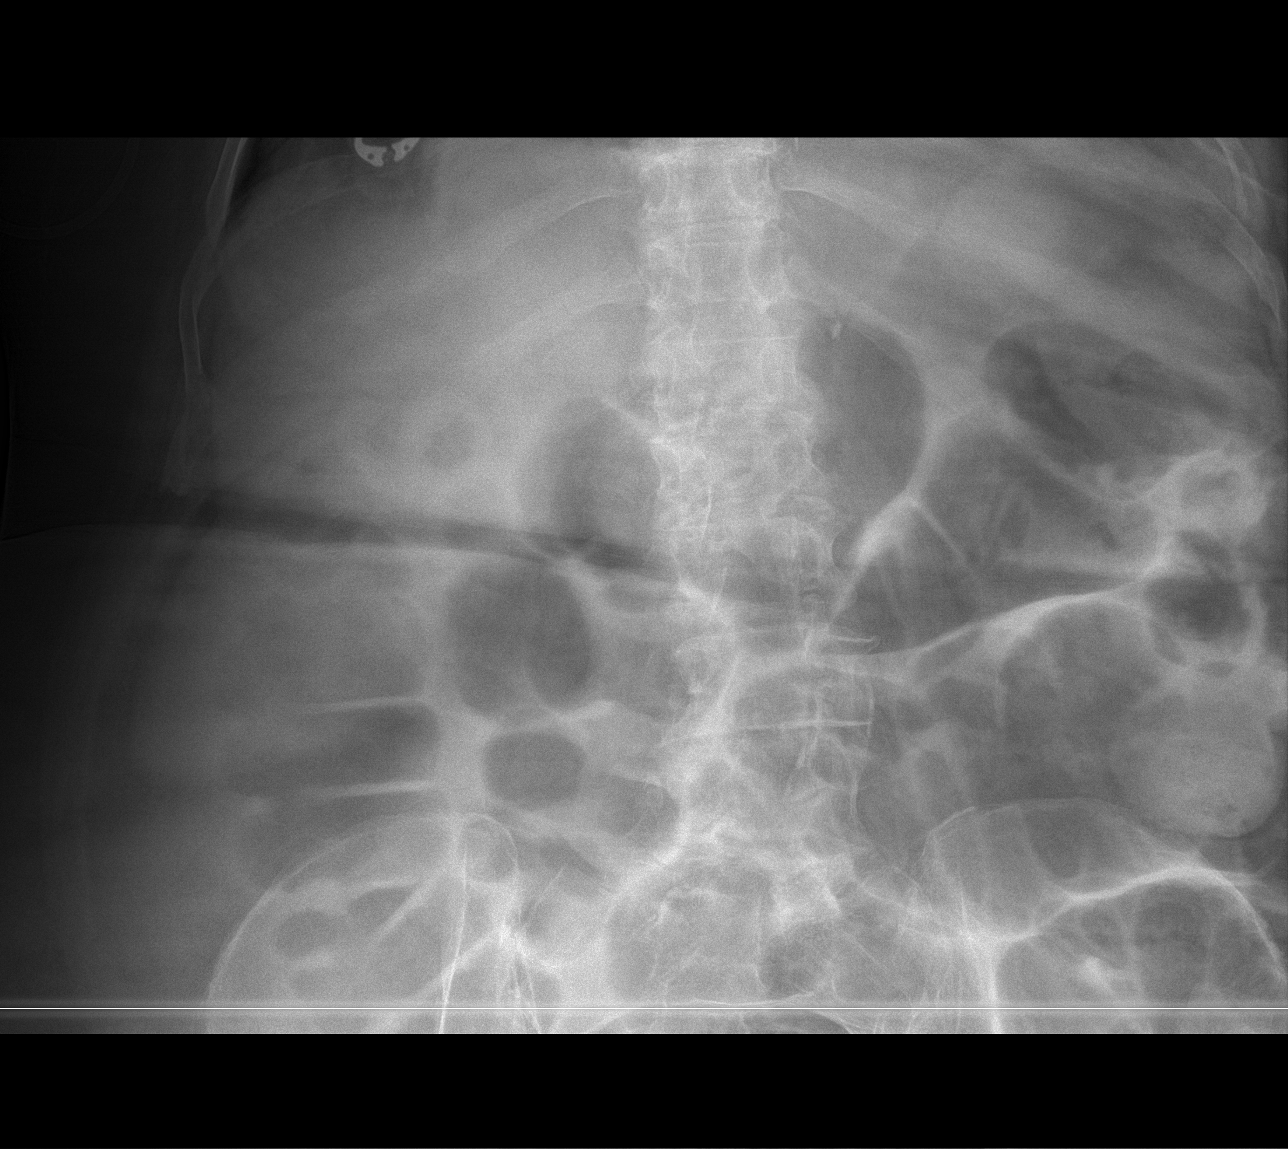
[im 4/4]
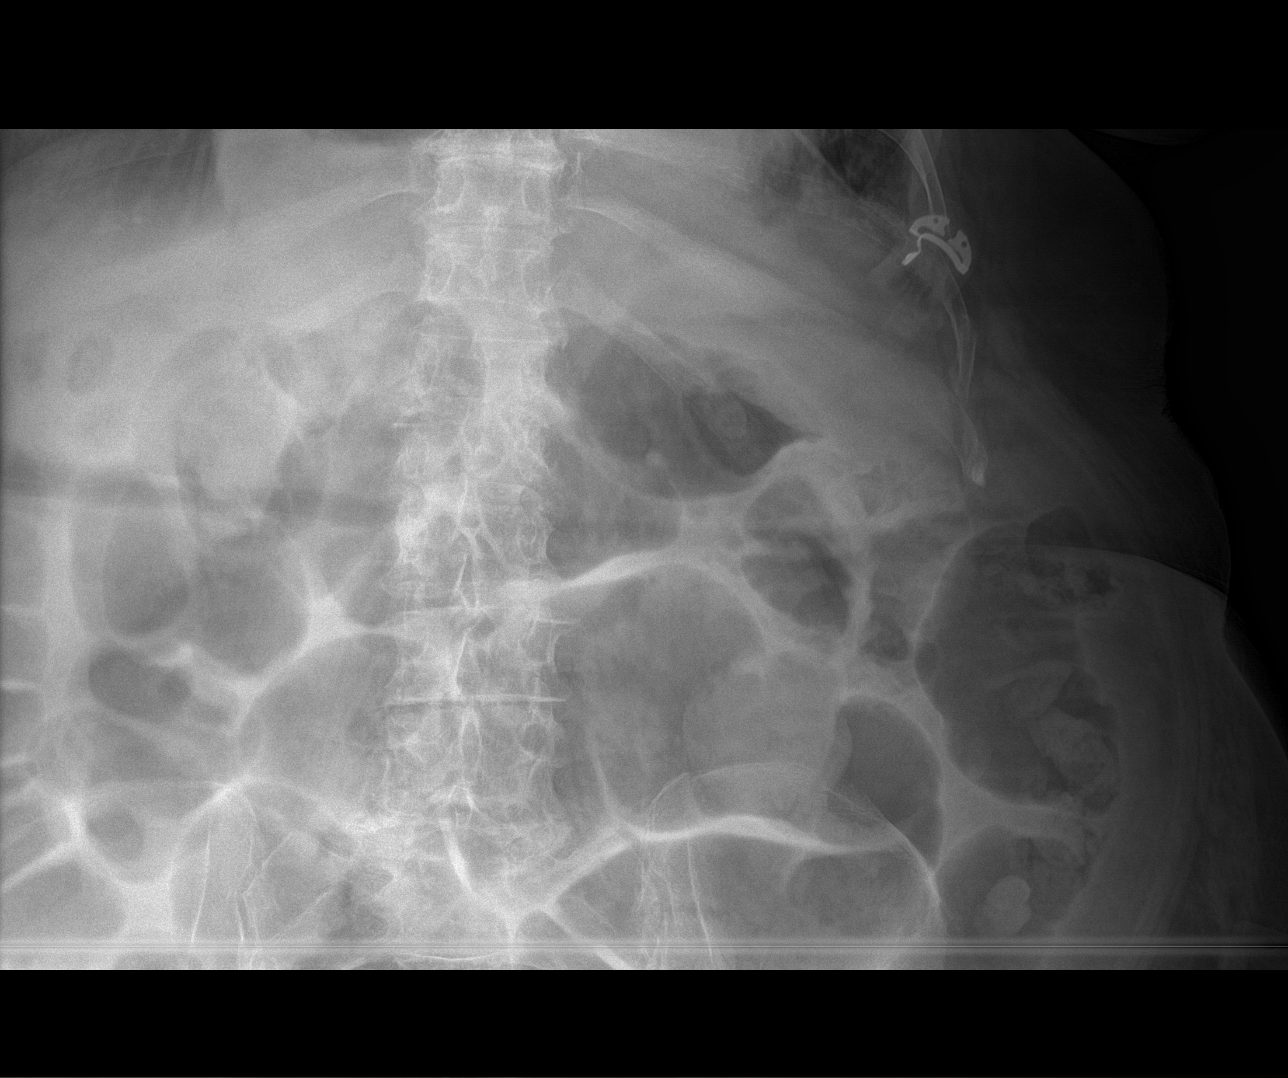

[4 of 4 positions shown; findings below may reference images not displayed]

FINDINGS: NG tube no longer visualized. Large hiatal hernia again noted.
Interim progression of colonic dilatation. Findings most likely
related to ileus. Follow-up abdominal series suggested to exclude
continued bowel distention/obstruction . No free air. Pelvic
calcifications consistent with phleboliths. Degenerative changes
lumbar spine.
IMPRESSION: 1. NG tube no longer visualized.
2. Mild progression of colonic distention. Follow-up abdominal
series suggested to demonstrate resolution.

## 2018-08-23 DEATH — deceased
